# Patient Record
Sex: Female | Born: 1975
Health system: Southern US, Community
[De-identification: ages and names within clinical notes are randomized; demographics above are authoritative.]

## PROBLEM LIST (undated history)

## (undated) ENCOUNTER — Inpatient Hospital Stay (HOSPITAL_COMMUNITY): Payer: Self-pay

## (undated) DIAGNOSIS — M797 Fibromyalgia: Secondary | ICD-10-CM

## (undated) DIAGNOSIS — I82409 Acute embolism and thrombosis of unspecified deep veins of unspecified lower extremity: Secondary | ICD-10-CM

## (undated) DIAGNOSIS — D259 Leiomyoma of uterus, unspecified: Secondary | ICD-10-CM

## (undated) DIAGNOSIS — R519 Headache, unspecified: Secondary | ICD-10-CM

## (undated) DIAGNOSIS — D649 Anemia, unspecified: Secondary | ICD-10-CM

## (undated) DIAGNOSIS — R51 Headache: Secondary | ICD-10-CM

## (undated) DIAGNOSIS — Z319 Encounter for procreative management, unspecified: Secondary | ICD-10-CM

## (undated) DIAGNOSIS — K219 Gastro-esophageal reflux disease without esophagitis: Secondary | ICD-10-CM

## (undated) DIAGNOSIS — M1990 Unspecified osteoarthritis, unspecified site: Secondary | ICD-10-CM

## (undated) HISTORY — PX: FOOT SURGERY: SHX648

## (undated) HISTORY — PX: CARDIAC CATHETERIZATION: SHX172

## (undated) HISTORY — PX: TONSILLECTOMY: SUR1361

## (undated) HISTORY — DX: Fibromyalgia: M79.7

## (undated) HISTORY — PX: TONSILLECTOMY: SHX5217

---

## 1998-07-15 ENCOUNTER — Emergency Department (HOSPITAL_COMMUNITY): Admission: EM | Admit: 1998-07-15 | Discharge: 1998-07-15 | Payer: Self-pay | Admitting: *Deleted

## 2005-01-03 ENCOUNTER — Emergency Department (HOSPITAL_COMMUNITY): Admission: EM | Admit: 2005-01-03 | Discharge: 2005-01-03 | Payer: Self-pay | Admitting: Emergency Medicine

## 2006-05-13 ENCOUNTER — Ambulatory Visit: Payer: Self-pay | Admitting: Family Medicine

## 2007-01-26 ENCOUNTER — Inpatient Hospital Stay (HOSPITAL_COMMUNITY): Admission: AD | Admit: 2007-01-26 | Discharge: 2007-01-29 | Payer: Self-pay | Admitting: Obstetrics and Gynecology

## 2007-07-06 ENCOUNTER — Ambulatory Visit: Payer: Self-pay | Admitting: Family Medicine

## 2007-10-17 ENCOUNTER — Emergency Department (HOSPITAL_COMMUNITY): Admission: EM | Admit: 2007-10-17 | Discharge: 2007-10-17 | Payer: Self-pay | Admitting: Emergency Medicine

## 2008-02-17 ENCOUNTER — Emergency Department (HOSPITAL_COMMUNITY): Admission: EM | Admit: 2008-02-17 | Discharge: 2008-02-17 | Payer: Self-pay | Admitting: Emergency Medicine

## 2008-04-11 ENCOUNTER — Ambulatory Visit: Payer: Self-pay | Admitting: Family Medicine

## 2008-08-30 ENCOUNTER — Ambulatory Visit: Payer: Self-pay | Admitting: Family Medicine

## 2008-09-23 ENCOUNTER — Ambulatory Visit: Payer: Self-pay | Admitting: Family Medicine

## 2008-11-04 ENCOUNTER — Encounter: Admission: RE | Admit: 2008-11-04 | Discharge: 2008-11-04 | Payer: Self-pay | Admitting: Internal Medicine

## 2008-11-16 ENCOUNTER — Ambulatory Visit: Payer: Self-pay | Admitting: Family Medicine

## 2008-11-18 ENCOUNTER — Ambulatory Visit: Payer: Self-pay | Admitting: Family Medicine

## 2009-05-22 ENCOUNTER — Ambulatory Visit: Payer: Self-pay | Admitting: Family Medicine

## 2009-07-03 ENCOUNTER — Emergency Department (HOSPITAL_COMMUNITY): Admission: EM | Admit: 2009-07-03 | Discharge: 2009-07-04 | Payer: Self-pay | Admitting: Emergency Medicine

## 2010-04-01 LAB — DIFFERENTIAL
Eosinophils Relative: 2 % (ref 0–5)
Lymphocytes Relative: 33 % (ref 12–46)
Lymphs Abs: 3 10*3/uL (ref 0.7–4.0)
Neutro Abs: 4.9 10*3/uL (ref 1.7–7.7)
Neutrophils Relative %: 54 % (ref 43–77)

## 2010-04-01 LAB — URINALYSIS, ROUTINE W REFLEX MICROSCOPIC
Bilirubin Urine: NEGATIVE
Hgb urine dipstick: NEGATIVE
Specific Gravity, Urine: 1.026 (ref 1.005–1.030)
Urobilinogen, UA: 1 mg/dL (ref 0.0–1.0)

## 2010-04-01 LAB — POCT I-STAT, CHEM 8
BUN: 14 mg/dL (ref 6–23)
Chloride: 108 mEq/L (ref 96–112)
Creatinine, Ser: 0.6 mg/dL (ref 0.4–1.2)
Potassium: 3.8 mEq/L (ref 3.5–5.1)
Sodium: 140 mEq/L (ref 135–145)

## 2010-04-01 LAB — POCT PREGNANCY, URINE: Preg Test, Ur: NEGATIVE

## 2010-04-01 LAB — URINE MICROSCOPIC-ADD ON

## 2010-04-01 LAB — CBC
HCT: 31.2 % — ABNORMAL LOW (ref 36.0–46.0)
Platelets: 297 10*3/uL (ref 150–400)
WBC: 9.1 10*3/uL (ref 4.0–10.5)

## 2010-04-01 LAB — HEMOCCULT GUIAC POC 1CARD (OFFICE): Fecal Occult Bld: POSITIVE

## 2010-04-01 LAB — WET PREP, GENITAL

## 2010-05-01 LAB — DIFFERENTIAL
Basophils Absolute: 0 10*3/uL (ref 0.0–0.1)
Basophils Relative: 1 % (ref 0–1)
Eosinophils Relative: 3 % (ref 0–5)
Monocytes Absolute: 0.6 10*3/uL (ref 0.1–1.0)
Monocytes Relative: 8 % (ref 3–12)

## 2010-05-01 LAB — BASIC METABOLIC PANEL
CO2: 30 mEq/L (ref 19–32)
Chloride: 107 mEq/L (ref 96–112)
Glucose, Bld: 86 mg/dL (ref 70–99)
Potassium: 3.4 mEq/L — ABNORMAL LOW (ref 3.5–5.1)
Sodium: 141 mEq/L (ref 135–145)

## 2010-05-01 LAB — CBC
HCT: 34.5 % — ABNORMAL LOW (ref 36.0–46.0)
Hemoglobin: 11.4 g/dL — ABNORMAL LOW (ref 12.0–15.0)
MCHC: 33.1 g/dL (ref 30.0–36.0)
RBC: 4.31 MIL/uL (ref 3.87–5.11)
RDW: 14 % (ref 11.5–15.5)

## 2010-05-15 DIAGNOSIS — O139 Gestational [pregnancy-induced] hypertension without significant proteinuria, unspecified trimester: Secondary | ICD-10-CM

## 2010-05-15 DIAGNOSIS — O321XX0 Maternal care for breech presentation, not applicable or unspecified: Secondary | ICD-10-CM

## 2010-05-29 NOTE — Op Note (Signed)
Peggy [name], Peggy [name]                   ACCOUNT NO.:  [id number]   MEDICAL RECORD NO.:  [id number]          PATIENT TYPE:  INP   LOCATION:  9118                          FACILITY:  [facility]   PHYSICIAN:  Malva Limes, M.D.    DATE OF BIRTH:  24-Mar-1975   DATE OF PROCEDURE:  DATE OF DISCHARGE:                               OPERATIVE REPORT   PREOPERATIVE DIAGNOSES:  1. Intrauterine pregnancy at 72 and 1/2 weeks estimated gestational      age.  2. Homero Fellers breech presentation.  3. Pregnancy-induced hypertension.  4. Headaches.   POSTOPERATIVE DIAGNOSIS:  1. Intrauterine pregnancy at 81 and 1/2 weeks estimated gestational      age.  2. Homero Fellers breech presentation.  3. Pregnancy-induced hypertension.  4. Headaches.   PROCEDURE:  Primary low transverse cesarean section.   SURGEON:  Malva Limes, M.D.   ANESTHESIA:  Spinal.   ANTIBIOTICS:  Ancef 1 gram.   DRAINS:  Foley bedside drainage.   ESTIMATED BLOOD LOSS:  900 mL.   COMPLICATIONS:  None.   SPECIMENS:  None.   PROCEDURE:  The patient was taken to the operating room where a spinal  anesthetic was administered without complications.  She was then placed  in the dorsal supine position with a left lateral tilt.  The patient was  prepped with Betadine and a Foley catheter was placed.  She was draped  in the usual fashion for this procedure.  A Pfannenstiel incision was  made.  This was carried down the fascia.  The fascia was then entered in  the midline and extended laterally with the Metzenbaum scissors.  Rectus  muscles were dissected from the fascia with a Bovie.  Rectus muscles  were divided in the midline and taken superiorly and inferiorly.  Parietal peritoneum was entered sharply and taken superiorly and  inferiorly.  The bladder flap was taken down sharply.  A low transverse  uterine incision was made in the midline and extended laterally with  blunt dissection.  The infant was delivered in breech presentation.  On  delivery of the head, the oropharynx and nostrils were bulb suctioned.  The cord was doubly clamped and cut and the infant was handed to the  waiting NICU team.  The placenta was then manually removed.  The uterus  was exteriorized and cavity wiped with a wet lap.  The uterine cavity  had no evidence of any septums or abnormalities.  The uterine incision  was closed in a single layer of 0 Monocryl in a running locking fashion  and the bladder flap was closed using 2-0 Monocryl suture in a running  fashion.  The uterus was placed back in the abdominal cavity.  Hemostasis was again checked and found to be adequate.  The parietal  peritoneum and rectus muscles were reapproximated in the midline with 2-  0 Monocryl suture.  The fascia was closed with 0 Monocryl suture in a  running fashion.  Subcutaneous tissue was made hemostatic with the  Bovie.  Stainless steel clips were used to close the skin.  The patient  tolerated  the procedure well.  She was taken to the recovery room in  stable condition.  Instrument, lap counts correct x2.           ______________________________  Malva Limes, M.D.     MA/MEDQ  D:  01/26/2007  T:  01/27/2007  Job:  [id number]

## 2010-06-01 NOTE — Discharge Summary (Signed)
Peggy [name], Peggy [name]                   ACCOUNT NO.:  [id number]   MEDICAL RECORD NO.:  [id number]          PATIENT TYPE:  INP   LOCATION:  9118                          FACILITY:  [facility]   PHYSICIAN:  Malva Limes, M.D.    DATE OF BIRTH:  [birth year]   DATE OF ADMISSION:  01/26/2007  DATE OF DISCHARGE:  01/29/2007                               DISCHARGE SUMMARY   FINAL DIAGNOSIS:  Intrauterine pregnancy at 68 and 1/[redacted] weeks gestation,  frank breech presentation, pregnancy-induced hypertension, maternal  headaches,  positive group B strep.   PROCEDURE:  Primary low transverse cesarean section.   SURGEON:  Dr. Malva Limes.   COMPLICATIONS:  None.   This 35 year old G1 P0 presented at 37-plus weeks' gestation, presented  to the [facility] complaining of headaches.  The patient's blood  pressures were elevated at this time.  Her PIH panel was normal.  She  was given Procardia 10 mg, and blood pressures returned normal at this  point.  The patient had been scheduled for a cesarean section in the  next couple weeks for a breech presentation.  A discussion was held with  the patient, and decision was made to proceed with a cesarean section at  this time secondary to these findings.  The patient was taken to the  operating room on January 26, 2007, by Dr. Malva Limes, where a  primary low transverse cesarean section was performed with the delivery  of a 6 pound, 12 ounce female infant with Apgars of 9 and 9.  The delivery  went without complications.  The patient's postoperative course was  benign without significant fevers.  Blood pressures stabilized.  The  patient was felt ready for discharge on postoperative day #3.  She was  sent home on a regular diet, told to decrease activities, told to  continue her vitamins.  Was was given Tylox #30 one to two every four  hours as needed for pain, was told she could use ibuprofen up to 600 mg  every 6 hours as needed for pain.  She was to  monitor her blood pressure  and symptoms, was to follow up in our office on Monday to recheck her  blood pressure.  Instructions and precautions were reviewed with the  patient.   LABORATORIES ON DISCHARGE:  The patient had a hemoglobin of 11.4, white  blood cell count of 11.6, platelets of 164,000 and a normal PIH panel  that was performed on the 12th.      Leilani Able, P.A.-C.    ______________________________  Malva Limes, M.D.    MB/MEDQ  D:  02/17/2007  T:  02/17/2007  Job:  [id number]

## 2010-07-06 ENCOUNTER — Encounter: Payer: Self-pay | Admitting: Family Medicine

## 2010-07-06 DIAGNOSIS — M797 Fibromyalgia: Secondary | ICD-10-CM

## 2010-09-15 ENCOUNTER — Inpatient Hospital Stay (INDEPENDENT_AMBULATORY_CARE_PROVIDER_SITE_OTHER)
Admission: RE | Admit: 2010-09-15 | Discharge: 2010-09-15 | Disposition: A | Discharge status: Home / Self Care | Payer: BC Managed Care – PPO | Source: Ambulatory Visit | Attending: Family Medicine | Admitting: Family Medicine

## 2010-09-15 DIAGNOSIS — M545 Low back pain, unspecified: Secondary | ICD-10-CM

## 2010-09-15 LAB — CBC
HCT: 34.6 % — ABNORMAL LOW (ref 36.0–46.0)
MCHC: 30.3 g/dL (ref 30.0–36.0)
Platelets: 327 10*3/uL (ref 150–400)
RDW: 17.1 % — ABNORMAL HIGH (ref 11.5–15.5)

## 2010-09-15 LAB — POCT URINALYSIS DIP (DEVICE)
Glucose, UA: NEGATIVE mg/dL
Nitrite: NEGATIVE
Protein, ur: 30 mg/dL — AB
Urobilinogen, UA: 0.2 mg/dL (ref 0.0–1.0)

## 2010-09-15 LAB — HCG, SERUM, QUALITATIVE: Preg, Serum: NEGATIVE

## 2010-09-15 LAB — DIFFERENTIAL
Basophils Absolute: 0 10*3/uL (ref 0.0–0.1)
Eosinophils Absolute: 0.2 10*3/uL (ref 0.0–0.7)
Eosinophils Relative: 2 % (ref 0–5)
Lymphs Abs: 2.9 10*3/uL (ref 0.7–4.0)
Monocytes Absolute: 0.8 10*3/uL (ref 0.1–1.0)

## 2010-09-15 LAB — WET PREP, GENITAL

## 2010-09-15 LAB — POCT PREGNANCY, URINE: Preg Test, Ur: NEGATIVE

## 2010-09-16 LAB — URINE CULTURE
Colony Count: NO GROWTH
Culture  Setup Time: 201209011720

## 2010-09-17 ENCOUNTER — Inpatient Hospital Stay (INDEPENDENT_AMBULATORY_CARE_PROVIDER_SITE_OTHER)
Admission: RE | Admit: 2010-09-17 | Discharge: 2010-09-17 | Disposition: A | Discharge status: Home / Self Care | Payer: BC Managed Care – PPO | Source: Ambulatory Visit | Attending: Family Medicine | Admitting: Family Medicine

## 2010-09-17 DIAGNOSIS — M549 Dorsalgia, unspecified: Secondary | ICD-10-CM

## 2010-09-17 DIAGNOSIS — D649 Anemia, unspecified: Secondary | ICD-10-CM

## 2010-09-17 DIAGNOSIS — N898 Other specified noninflammatory disorders of vagina: Secondary | ICD-10-CM

## 2010-09-18 LAB — GC/CHLAMYDIA PROBE AMP, GENITAL: GC Probe Amp, Genital: NEGATIVE

## 2010-10-04 LAB — CBC
HCT: 40.7
Hemoglobin: 13.9
MCHC: 34.3
MCHC: 34.6
MCV: 88
Platelets: 164
RBC: 3.74 — ABNORMAL LOW
RDW: 14.8

## 2010-10-04 LAB — URINALYSIS, ROUTINE W REFLEX MICROSCOPIC
Protein, ur: NEGATIVE
Urobilinogen, UA: 0.2

## 2010-10-04 LAB — URINE MICROSCOPIC-ADD ON

## 2010-10-04 LAB — COMPREHENSIVE METABOLIC PANEL
BUN: 7
Calcium: 9.4
Glucose, Bld: 81
Sodium: 136
Total Protein: 6.3

## 2010-10-04 LAB — URIC ACID: Uric Acid, Serum: 5.3

## 2010-10-04 LAB — LACTATE DEHYDROGENASE: LDH: 169

## 2010-10-16 LAB — CBC
HCT: 39.3
Hemoglobin: 13.1
MCHC: 33.4
MCV: 82.3
RDW: 13.5

## 2010-10-16 LAB — BASIC METABOLIC PANEL
CO2: 26
Calcium: 9.3
Chloride: 107
Glucose, Bld: 118 — ABNORMAL HIGH
Sodium: 139

## 2010-10-16 LAB — URINALYSIS, ROUTINE W REFLEX MICROSCOPIC
Bilirubin Urine: NEGATIVE
Glucose, UA: NEGATIVE
Ketones, ur: NEGATIVE
Leukocytes, UA: NEGATIVE
Protein, ur: 30 — AB

## 2010-10-16 LAB — DIFFERENTIAL
Basophils Absolute: 0
Basophils Relative: 0
Eosinophils Absolute: 0.1
Eosinophils Relative: 2
Monocytes Absolute: 0.8
Neutro Abs: 3.9

## 2010-10-16 LAB — GC/CHLAMYDIA PROBE AMP, GENITAL
Chlamydia, DNA Probe: NEGATIVE
GC Probe Amp, Genital: NEGATIVE

## 2010-12-24 ENCOUNTER — Ambulatory Visit (INDEPENDENT_AMBULATORY_CARE_PROVIDER_SITE_OTHER): Payer: BC Managed Care – PPO | Admitting: Family Medicine

## 2010-12-24 ENCOUNTER — Encounter: Payer: Self-pay | Admitting: Family Medicine

## 2010-12-24 DIAGNOSIS — J111 Influenza due to unidentified influenza virus with other respiratory manifestations: Secondary | ICD-10-CM

## 2010-12-24 DIAGNOSIS — J101 Influenza due to other identified influenza virus with other respiratory manifestations: Secondary | ICD-10-CM

## 2010-12-24 DIAGNOSIS — R059 Cough, unspecified: Secondary | ICD-10-CM

## 2010-12-24 DIAGNOSIS — R509 Fever, unspecified: Secondary | ICD-10-CM

## 2010-12-24 DIAGNOSIS — R05 Cough: Secondary | ICD-10-CM

## 2010-12-24 LAB — POCT INFLUENZA A/B
Influenza A, POC: POSITIVE
Influenza B, POC: NEGATIVE

## 2010-12-24 NOTE — Progress Notes (Signed)
Chief Complaint: coughing,fever, mid back pain and vomiting off and on since Saturday  HPI: 2 days ago began with headache and vomiting, then started with lower back pain, fever to 101, and then coughing began late last night.  +runny nose, slight sore throat, sinus pressure behind both eyes.  +postnasal drip.  Nasal mucus is bloody, sometimes clear, sometimes thick and yellow.  Cough is dry.  +ongoing nausea, vomited after eating today (not post-tussive); no diarrhea.  Hasn't had a BM in 2 days, but has eaten very little.  Decreased appetite.  +sick contact--her husband with similar but milder illness.  Has fibromyalgia, so has chronic pain.  Hasn't noticed a significant difference in myalgias with this illness  Hasn't gotten a flu shot this year.  Past Medical History  Diagnosis Date  . Fibromyalgia     Past Surgical History  Procedure Date  . Cesarean section   . Tonsillectomy   . Foot surgery     right foot    History   Social History  . Marital Status: Married    Spouse Name: N/A    Number of Children: 1  . Years of Education: N/A   Occupational History  . Occupational hygienist    Social History Main Topics  . Smoking status: Never Smoker   . Smokeless tobacco: Never Used  . Alcohol Use: Yes     1-2 drinks per month.  . Drug Use: No  . Sexually Active: Not on file   Other Topics Concern  . Not on file   Social History Narrative  . No narrative on file   Current outpatient prescriptions:pregabalin (LYRICA) 75 MG capsule, Take 75 mg by mouth 2 (two) times daily.  , Disp: , Rfl: ;  traMADol (ULTRAM) 50 MG tablet, Take 50 mg by mouth as needed. , Disp: , Rfl:   No Known Allergies  ROS:  Denies rashes, diarrhea.  See HPI.  LMP 1.5 weeks ago  PHYSICAL EXAM: BP 120/70  Pulse 80  Temp(Src) 101 F (38.3 C) (Oral)  Ht 5\' 7"  (1.702 m)  Wt 163 lb (73.936 kg)  BMI 25.53 kg/m2  LMP 12/13/2010 Mildly ill-appearing female in no distress Rare coughing, no sniffling TM's and  EAC's normal.  OP clear.  PERRL, EOMI, conjunctiva clear. Nasal mucosa fairly normal, sinuses nontender Heart: regular rate and rhythm without murmur Lungs: clear bilaterally Abdomen: mild tenderness at umbilicus to right side of abdomen including RLQ.  No rebound tenderness or guarding, is more diffuse, and mild.  Normal bowel sounds Skin: no rash  ASSESSMENT/PLAN: 1. Fever  POCT Influenza A/B  2. Cough  POCT Influenza A/B  3. Influenza A      +influenza A Supportive management F/u if worsening symptoms, worsening abdominal pain

## 2010-12-24 NOTE — Patient Instructions (Addendum)
Fever control with either tylenol and /or ibuprofen or aleve (pain, fever, body aches) Decongestants (ie sudafed--for sinus pain and runny nose), expectorants (ie Mucinex--if you're having thick phlegm). If cough is worse, and not controlled by Mucinex alone, then can add cough suppressant (ie Delsym syrup or change to the Mucinex DM)  Return if fevers persist or worsen, ongoing or worsening symptoms, especially any shortness of breath, chest pain (other than from coughing) or worsening ab abdominal pain.  Keep yourself well hydrated, although drink slowly.  Influenza Facts Flu (influenza) is a contagious respiratory illness caused by the influenza viruses. It can cause mild to severe illness. While most healthy people recover from the flu without specific treatment and without complications, older people, young children, and people with certain health conditions are at higher risk for serious complications from the flu, including death. CAUSES   The flu virus is spread from person to person by respiratory droplets from coughing and sneezing.   A person can also become infected by touching an object or surface with a virus on it and then touching their mouth, eye or nose.   Adults may be able to infect others from 1 day before symptoms occur and up to 7 days after getting sick. So it is possible to give someone the flu even before you know you are sick and continue to infect others while you are sick.  SYMPTOMS   Fever (usually high).   Headache.   Tiredness (can be extreme).   Cough.   Sore throat.   Runny or stuffy nose.   Body aches.   Diarrhea and vomiting may also occur, particularly in children.   These symptoms are referred to as "flu-like symptoms". A lot of different illnesses, including the common cold, can have similar symptoms.  DIAGNOSIS   There are tests that can determine if you have the flu as long you are tested within the first 2 or 3 days of illness.   A  doctor's exam and additional tests may be needed to identify if you have a disease that is a complicating the flu.  RISKS AND COMPLICATIONS  Some of the complications caused by the flu include:  Bacterial pneumonia or progressive pneumonia caused by the flu virus.   Loss of body fluids (dehydration).   Worsening of chronic medical conditions, such as heart failure, asthma, or diabetes.   Sinus problems and ear infections.  HOME CARE INSTRUCTIONS   Seek medical care early on.   If you are at high risk from complications of the flu, consult your health-care provider as soon as you develop flu-like symptoms. Those at high risk for complications include:   People 65 years or older.   People with chronic medical conditions, including diabetes.   Pregnant women.   Young children.   Your caregiver may recommend use of an antiviral medication to help treat the flu.   If you get the flu, get plenty of rest, drink a lot of liquids, and avoid using alcohol and tobacco.   You can take over-the-counter medications to relieve the symptoms of the flu if your caregiver approves. (Never give aspirin to children or teenagers who have flu-like symptoms, particularly fever).  PREVENTION  The single best way to prevent the flu is to get a flu vaccine each fall. Other measures that can help protect against the flu are:  Antiviral Medications   A number of antiviral drugs are approved for use in preventing the flu. These are prescription medications,  and a doctor should be consulted before they are used.   Habits for Good Health   Cover your nose and mouth with a tissue when you cough or sneeze, throw the tissue away after you use it.   Wash your hands often with soap and water, especially after you cough or sneeze. If you are not near water, use an alcohol-based hand cleaner.   Avoid people who are sick.   If you get the flu, stay home from work or school. Avoid contact with other people so  that you do not make them sick, too.   Try not to touch your eyes, nose, or mouth as germs ore often spread this way.  IN CHILDREN, EMERGENCY WARNING SIGNS THAT NEED URGENT MEDICAL ATTENTION:  Fast breathing or trouble breathing.   Bluish skin color.   Not drinking enough fluids.   Not waking up or not interacting.   Being so irritable that the child does not want to be held.   Flu-like symptoms improve but then return with fever and worse cough.   Fever with a rash.  IN ADULTS, EMERGENCY WARNING SIGNS THAT NEED URGENT MEDICAL ATTENTION:  Difficulty breathing or shortness of breath.   Pain or pressure in the chest or abdomen.   Sudden dizziness.   Confusion.   Severe or persistent vomiting.  SEEK IMMEDIATE MEDICAL CARE IF:  You or someone you know is experiencing any of the symptoms above. When you arrive at the emergency center,report that you think you have the flu. You may be asked to wear a mask and/or sit in a secluded area to protect others from getting sick. MAKE SURE YOU:   Understand these instructions.   Monitor your condition.   Seek medical care if you are getting worse, or not improving.  Document Released: 01/03/2003 Document Revised: 09/12/2010 Document Reviewed: 09/29/2008 [facility] Patient Information 2012 [place], [place].

## 2011-02-28 ENCOUNTER — Encounter: Payer: Self-pay | Admitting: Internal Medicine

## 2011-03-06 ENCOUNTER — Ambulatory Visit (INDEPENDENT_AMBULATORY_CARE_PROVIDER_SITE_OTHER): Payer: BC Managed Care – PPO | Admitting: Family Medicine

## 2011-03-06 ENCOUNTER — Encounter: Payer: Self-pay | Admitting: Family Medicine

## 2011-03-06 DIAGNOSIS — Z0000 Encounter for general adult medical examination without abnormal findings: Secondary | ICD-10-CM

## 2011-03-06 DIAGNOSIS — Z23 Encounter for immunization: Secondary | ICD-10-CM

## 2011-03-06 DIAGNOSIS — Z111 Encounter for screening for respiratory tuberculosis: Secondary | ICD-10-CM

## 2011-03-06 DIAGNOSIS — M797 Fibromyalgia: Secondary | ICD-10-CM

## 2011-03-06 DIAGNOSIS — R5381 Other malaise: Secondary | ICD-10-CM

## 2011-03-06 DIAGNOSIS — R5383 Other fatigue: Secondary | ICD-10-CM

## 2011-03-06 DIAGNOSIS — IMO0001 Reserved for inherently not codable concepts without codable children: Secondary | ICD-10-CM

## 2011-03-06 LAB — LIPID PANEL
HDL: 55 mg/dL (ref >39)
Total CHOL/HDL Ratio: 4.4 Ratio

## 2011-03-06 LAB — COMPREHENSIVE METABOLIC PANEL
AST: 27 U/L (ref 0–37)
BUN: 10 mg/dL (ref 6–23)
Calcium: 9.4 mg/dL (ref 8.4–10.5)
Chloride: 105 mEq/L (ref 96–112)
Creat: 0.61 mg/dL (ref 0.50–1.10)

## 2011-03-06 NOTE — Progress Notes (Signed)
  Subjective:    Patient ID: Peggy [name], female    DOB: 15-Oct-1975, 36 y.o.   MRN: [id number]  HPI She is here for a complete examination. She does get regular gynecologic care. She also has underlying fibromyalgia and is being followed by a rheumatologist. Presently she is on Lyrica as well as Ultram. She is having more difficulty with fatigue as well as muscle aches and does plan to see her rheumatologist in the near future. She and her husband are in the process of trying to adopt.   Review of Systems  Constitutional: Positive for fatigue.  HENT: Negative.   Eyes: Negative.   Respiratory: Negative.   Cardiovascular: Negative.   Gastrointestinal: Negative.   Genitourinary: Negative.   Musculoskeletal: Positive for myalgias.  Neurological: Positive for weakness.  Hematological: Negative.   Psychiatric/Behavioral: Negative.        Objective:   Physical Exam BP 116/70  Pulse 92  Ht 5\' 7"  (1.702 m)  Wt 166 lb (75.297 kg)  BMI 26.00 kg/m2  General Appearance:    Alert, cooperative, no distress, appears stated age  Head:    Normocephalic, without obvious abnormality, atraumatic  Eyes:    PERRL, conjunctiva/corneas clear, EOM's intact, fundi    benign  Ears:    Normal TM's and external ear canals  Nose:   Nares normal, mucosa normal, no drainage or sinus   tenderness  Throat:   Lips, mucosa, and tongue normal; teeth and gums normal  Neck:   Supple, no lymphadenopathy;  thyroid:  no   enlargement/tenderness/nodules; no carotid   bruit or JVD  Back:    Spine nontender, no curvature, ROM normal, no CVA     tenderness  Lungs:     Clear to auscultation bilaterally without wheezes, rales or     ronchi; respirations unlabored  Chest Wall:    No tenderness or deformity   Heart:    Regular rate and rhythm, S1 and S2 normal, no murmur, rub   or gallop  Breast Exam:    Deferred to GYN  Abdomen:     Soft, non-tender, nondistended, normoactive bowel sounds,    no masses, no  hepatosplenomegaly  Genitalia:    Deferred to GYN     Extremities:   No clubbing, cyanosis or edema  Pulses:   2+ and symmetric all extremities  Skin:   Skin color, texture, turgor normal, no rashes or lesions  Lymph nodes:   Cervical, supraclavicular, and axillary nodes normal  Neurologic:   CNII-XII intact, normal strength, sensation and gait; reflexes 2+ and symmetric throughout          Psych:   Normal mood, affect, hygiene and grooming.          Assessment & Plan:   1. Routine general medical examination at a health care facility  TB Skin Test, CBC with Differential, Comprehensive metabolic panel, Lipid panel, Tdap vaccine greater than or equal to 7yo IM  2. Fibromyalgia muscle pain  CBC with Differential, Comprehensive metabolic panel  3. Fatigue  TSH  4. Screening examination for pulmonary tuberculosis

## 2011-03-07 LAB — CBC WITH DIFFERENTIAL/PLATELET
Basophils Absolute: 0 10*3/uL (ref 0.0–0.1)
Basophils Relative: 0 % (ref 0–1)
Eosinophils Absolute: 0.2 10*3/uL (ref 0.0–0.7)
Eosinophils Relative: 2 % (ref 0–5)
HCT: 35.9 % — ABNORMAL LOW (ref 36.0–46.0)
Lymphocytes Relative: 39 % (ref 12–46)
MCH: 21.1 pg — ABNORMAL LOW (ref 26.0–34.0)
MCHC: 29 g/dL — ABNORMAL LOW (ref 30.0–36.0)
MCV: 72.7 fL — ABNORMAL LOW (ref 78.0–100.0)
Monocytes Absolute: 0.6 10*3/uL (ref 0.1–1.0)
RDW: 17.8 % — ABNORMAL HIGH (ref 11.5–15.5)

## 2011-04-08 ENCOUNTER — Emergency Department (INDEPENDENT_AMBULATORY_CARE_PROVIDER_SITE_OTHER): Payer: BC Managed Care – PPO

## 2011-04-08 ENCOUNTER — Emergency Department (INDEPENDENT_AMBULATORY_CARE_PROVIDER_SITE_OTHER)
Admission: EM | Admit: 2011-04-08 | Discharge: 2011-04-08 | Disposition: A | Discharge status: Home / Self Care | Payer: BC Managed Care – PPO | Source: Home / Self Care | Attending: Emergency Medicine | Admitting: Emergency Medicine

## 2011-04-08 ENCOUNTER — Encounter (HOSPITAL_COMMUNITY): Payer: Self-pay

## 2011-04-08 ENCOUNTER — Emergency Department (HOSPITAL_COMMUNITY): Payer: BC Managed Care – PPO

## 2011-04-08 DIAGNOSIS — S59909A Unspecified injury of unspecified elbow, initial encounter: Secondary | ICD-10-CM

## 2011-04-08 DIAGNOSIS — S6990XA Unspecified injury of unspecified wrist, hand and finger(s), initial encounter: Secondary | ICD-10-CM

## 2011-04-08 DIAGNOSIS — S6991XA Unspecified injury of right wrist, hand and finger(s), initial encounter: Secondary | ICD-10-CM

## 2011-04-08 HISTORY — DX: Encounter for procreative management, unspecified: Z31.9

## 2011-04-08 MED ORDER — IBUPROFEN 800 MG PO TABS: 800.0000 mg | ORAL_TABLET | Freq: Once | ORAL | Status: DC

## 2011-04-08 MED ORDER — HYDROCODONE-ACETAMINOPHEN 5-325 MG PO TABS: 2.0000 | ORAL_TABLET | ORAL | Status: AC | PRN

## 2011-04-08 MED ORDER — IBUPROFEN 800 MG PO TABS
ORAL_TABLET | ORAL | Status: AC
  Filled 2011-04-08: qty 1

## 2011-04-08 MED ORDER — ACETAMINOPHEN 325 MG PO TABS
ORAL_TABLET | ORAL | Status: AC
  Filled 2011-04-08: qty 3

## 2011-04-08 MED ORDER — ACETAMINOPHEN 500 MG PO TABS
1000.0000 mg | ORAL_TABLET | Freq: Once | ORAL | Status: AC
  Administered 2011-04-08: 1000 mg via ORAL

## 2011-04-08 NOTE — ED Notes (Signed)
States she tripped over husband's feet last PM, fell onto right arm; has pain in right distal forarm/wrist

## 2011-04-08 NOTE — ED Provider Notes (Signed)
History     CSN: [id number]  Arrival date & time 04/08/11  1144   First MD Initiated Contact with Patient 04/08/11 1212      Chief Complaint  Patient presents with  . Wrist Pain    (Consider location/radiation/quality/duration/timing/severity/associated sxs/prior treatment) HPI Comments: Less patient tripped over her husband feet and felt her right arm specifically her right wrist has been expressing pain since last night and obvious swelling this morning. Patient describes some tingling on her right fingers distally.  Patient is a 36 y.o. female presenting with wrist pain.  Wrist Pain This is a new problem. The current episode started yesterday. The problem occurs constantly. The problem has been gradually worsening. Pertinent negatives include no chest pain. The symptoms are aggravated by bending and walking. The symptoms are relieved by ice. She has tried a cold compress for the symptoms. The treatment provided no relief.    Past Medical History  Diagnosis Date  . Fibromyalgia   . Infertility management     Past Surgical History  Procedure Date  . Cesarean section   . Tonsillectomy   . Foot surgery     right foot    History reviewed. No pertinent family history.  History  Substance Use Topics  . Smoking status: Never Smoker   . Smokeless tobacco: Never Used  . Alcohol Use: Yes     1-2 drinks per month.    OB History    Grav Para Term Preterm Abortions TAB SAB Ect Mult Living                  Review of Systems  Constitutional: Negative for fever and appetite change.  Cardiovascular: Negative for chest pain.  Musculoskeletal: Positive for joint swelling.  Neurological: Positive for numbness. Negative for weakness.    Allergies  Review of patient's allergies indicates no known allergies.  Home Medications   Current Outpatient Rx  Name Route Sig Dispense Refill  . HYDROCODONE-ACETAMINOPHEN 5-325 MG PO TABS Oral Take 2 tablets by mouth every 4 (four)  hours as needed for pain. 10 tablet 0  . PREGABALIN 75 MG PO CAPS Oral Take 75 mg by mouth 2 (two) times daily.      . TRAMADOL HCL 50 MG PO TABS Oral Take 50 mg by mouth as needed.       BP 114/76  Pulse 87  Temp(Src) 98.5 F (36.9 C) (Oral)  Resp 14  SpO2 99%  LMP 03/23/2011  Physical Exam  Nursing note and vitals reviewed. Constitutional: She appears well-developed and well-nourished.  Neck: Neck supple.  Musculoskeletal:       Right wrist: She exhibits decreased range of motion, tenderness, bony tenderness and swelling. She exhibits no crepitus, no deformity and no laceration.       Arms: Neurological: She is alert.  Skin: No rash noted.    ED Course  Procedures (including critical care time)  Labs Reviewed - No data to display Dg Wrist Complete Right  04/08/2011  *RADIOLOGY REPORT*  Clinical Data: Larey Seat.  Right wrist pain.  RIGHT WRIST - COMPLETE 3+ VIEW  Comparison: None  Findings: The joint spaces are maintained.  No acute fracture.  IMPRESSION: No acute bony findings.  Original Report Authenticated By: P. Loralie Champagne, M.D.     1. Right wrist injury       MDM  Recommended patient to followup with an orthopedic Dr in 7-10 days if focalized tenderness persists. Radiologist reported x-ray is negative. Did notice a mild  cortical what seemed to be cortical disruption on the radial surface. If partial immobilize his wrist and have recommended to keep elevated as much as possible next 72 hours        Jimmie Molly, MD 04/08/11 1449

## 2011-04-08 NOTE — Discharge Instructions (Signed)
S. discuss if significant discomfort persists beyond 7-10 days followup with the orthopedic Dr. as discussed as you may have a cold fracture. For the next 48 hours try to keep your arm elevated as much as possible

## 2011-05-03 ENCOUNTER — Other Ambulatory Visit: Payer: Self-pay | Admitting: Family Medicine

## 2011-05-03 MED ORDER — TRAMADOL HCL 50 MG PO TABS: 50.0000 mg | ORAL_TABLET | ORAL | Status: DC | PRN

## 2011-08-17 ENCOUNTER — Emergency Department (HOSPITAL_COMMUNITY)
Admission: EM | Admit: 2011-08-17 | Discharge: 2011-08-17 | Disposition: A | Discharge status: Home / Self Care | Payer: BC Managed Care – PPO | Attending: Emergency Medicine | Admitting: Emergency Medicine

## 2011-08-17 ENCOUNTER — Encounter (HOSPITAL_COMMUNITY): Payer: Self-pay | Admitting: Emergency Medicine

## 2011-08-17 ENCOUNTER — Emergency Department (HOSPITAL_COMMUNITY): Payer: BC Managed Care – PPO

## 2011-08-17 DIAGNOSIS — D649 Anemia, unspecified: Secondary | ICD-10-CM

## 2011-08-17 DIAGNOSIS — H538 Other visual disturbances: Secondary | ICD-10-CM | POA: Insufficient documentation

## 2011-08-17 DIAGNOSIS — H53149 Visual discomfort, unspecified: Secondary | ICD-10-CM | POA: Insufficient documentation

## 2011-08-17 DIAGNOSIS — IMO0001 Reserved for inherently not codable concepts without codable children: Secondary | ICD-10-CM | POA: Insufficient documentation

## 2011-08-17 DIAGNOSIS — Z79899 Other long term (current) drug therapy: Secondary | ICD-10-CM | POA: Insufficient documentation

## 2011-08-17 LAB — CBC
MCH: 21.4 pg — ABNORMAL LOW (ref 26.0–34.0)
MCHC: 30.6 g/dL (ref 30.0–36.0)
MCV: 70 fL — ABNORMAL LOW (ref 78.0–100.0)
Platelets: 320 10*3/uL (ref 150–400)
RDW: 16.3 % — ABNORMAL HIGH (ref 11.5–15.5)

## 2011-08-17 LAB — BASIC METABOLIC PANEL
Calcium: 8.8 mg/dL (ref 8.4–10.5)
Creatinine, Ser: 0.61 mg/dL (ref 0.50–1.10)
GFR calc Af Amer: >90 mL/min (ref >90)

## 2011-08-17 MED ORDER — TEARS RENEWED OP SOLN: 2.0000 [drp] | Freq: Four times a day (QID) | OPHTHALMIC | Status: DC | PRN

## 2011-08-17 MED ORDER — TETRACAINE HCL 0.5 % OP SOLN
1.0000 [drp] | Freq: Once | OPHTHALMIC | Status: AC
  Administered 2011-08-17: 2 [drp] via OPHTHALMIC
  Filled 2011-08-17: qty 2

## 2011-08-17 NOTE — ED Provider Notes (Signed)
History     CSN: [id number] Arrival date & time 08/17/11  1118 First MD Initiated Contact with Patient 08/17/11 1152      Chief Complaint  Patient presents with  . Blurred Vision   HPI Pt presents with blurred vision that started this morning when she woke up.  Pt states her vision is blurred in both eyes.  She can still see light and objects but they are very blurry.  Her eyes feel like they are burning as well.  No trauma.  No foreign bodies or chemicals to her eyes.  Past Medical History  Diagnosis Date  . Fibromyalgia   . Infertility management     Past Surgical History  Procedure Date  . Cesarean section   . Tonsillectomy   . Foot surgery     right foot  . Cardiac catheterization approx 8 years ago    for chest pain,     History reviewed. No pertinent family history.  History  Substance Use Topics  . Smoking status: Never Smoker   . Smokeless tobacco: Never Used  . Alcohol Use: Yes     1-2 drinks per month.    OB History    Grav Para Term Preterm Abortions TAB SAB Ect Mult Living                  Review of Systems  All other systems reviewed and are negative.    Allergies  Review of patient's allergies indicates no known allergies.  Home Medications   Current Outpatient Rx  Name Route Sig Dispense Refill  . PREGABALIN 75 MG PO CAPS Oral Take 75 mg by mouth 2 (two) times daily.     . TRAMADOL HCL 50 MG PO TABS Oral Take 50 mg by mouth every 8 (eight) hours as needed. PAIN      BP 138/92  Pulse 91  Temp 98.8 F (37.1 C) (Oral)  Resp 16  SpO2 100%  LMP 08/12/2011  Physical Exam  Nursing note and vitals reviewed. Constitutional: She appears well-developed and well-nourished. No distress.  HENT:  Head: Normocephalic and atraumatic.  Right Ear: External ear normal.  Left Ear: External ear normal.  Eyes: Conjunctivae and EOM are normal. Pupils are equal, round, and reactive to light. Right eye exhibits no discharge. Left eye exhibits no  discharge. Right conjunctiva is not injected. Right conjunctiva has no hemorrhage. Left conjunctiva is not injected. Left conjunctiva has no hemorrhage. No scleral icterus. Right eye exhibits normal extraocular motion. Left eye exhibits normal extraocular motion.  Fundoscopic exam:      The right eye shows no exudate, no hemorrhage and no papilledema.       The left eye shows no exudate, no hemorrhage and no papilledema.  Neck: Neck supple. No tracheal deviation present.  Cardiovascular: Normal rate, regular rhythm and intact distal pulses.   Pulmonary/Chest: Effort normal and breath sounds normal. No stridor. No respiratory distress. She has no wheezes. She has no rales.  Abdominal: Soft. Bowel sounds are normal. She exhibits no distension. There is no tenderness. There is no rebound and no guarding.  Musculoskeletal: She exhibits no edema and no tenderness.  Neurological: She is alert. She has normal strength. No sensory deficit. Cranial nerve deficit:  no gross defecits noted. She exhibits normal muscle tone. She displays no seizure activity. Coordination normal.  Skin: Skin is warm and dry. No rash noted.  Psychiatric: She has a normal mood and affect.    ED Course  Procedures (  including critical care time)   Labs Reviewed  CBC  BASIC METABOLIC PANEL   Ct Head Wo Contrast  08/17/2011  *RADIOLOGY REPORT*  Clinical Data: Blurred vision.  Photophobia.  CT HEAD WITHOUT CONTRAST  Technique:  Contiguous axial images were obtained from the base of the skull through the vertex without contrast.  Comparison: Head CT 02/17/2008.  Findings: No acute intracranial abnormality.  Specifically, no evidence of acute/subacute cerebral ischemia, no evidence of acute intracerebral hemorrhage, no focal mass, mass effect, hydrocephalus or abnormal intra or extra-axial fluid collections.  No acute displaced skull fractures are identified.  Visualized paranasal sinuses and mastoids are well pneumatized.   IMPRESSION: 1.  No acute intracranial abnormalities. 2.  The appearance of the brain is normal.  Original Report Authenticated By: Florencia Reasons, M.D.     1. Blurred vision   2. Anemia       MDM  Ocular pressure 16 bilaterally.  Bilateral acute vision changes.  NO mass noted on CT.  Atypical for posterior circulation event.  Pt does have some eye discomfort.  ?neuritis (unlikely bilaterally).  No sign of glaucoma or acute infection.  Pt denies chemical exposure.  D/w Dr Allyne Gee who recommends eye lubricants (possible unknown chemical exposure) and follow up with him on Monday.        Celene Kras, MD 08/17/11 1409

## 2011-08-17 NOTE — ED Notes (Addendum)
C/o blurry vision started 2 days ago- went away, but started again this morning. Both eyes- equally blurry per pt. Denies headache. Has eaten this morning,  Has far away "stare" not looking at staff-

## 2011-08-20 ENCOUNTER — Other Ambulatory Visit: Payer: Self-pay | Admitting: Ophthalmology

## 2011-08-20 ENCOUNTER — Inpatient Hospital Stay
Admission: RE | Admit: 2011-08-20 | Discharge: 2011-08-20 | Payer: BC Managed Care – PPO | Source: Ambulatory Visit | Attending: Ophthalmology | Admitting: Ophthalmology

## 2011-08-20 ENCOUNTER — Other Ambulatory Visit: Payer: BC Managed Care – PPO

## 2011-08-20 DIAGNOSIS — H468 Other optic neuritis: Secondary | ICD-10-CM

## 2011-08-21 ENCOUNTER — Ambulatory Visit
Admission: RE | Admit: 2011-08-21 | Discharge: 2011-08-21 | Disposition: A | Discharge status: Home / Self Care | Payer: BC Managed Care – PPO | Source: Ambulatory Visit | Attending: Ophthalmology | Admitting: Ophthalmology

## 2011-08-21 DIAGNOSIS — H468 Other optic neuritis: Secondary | ICD-10-CM

## 2011-08-21 MED ORDER — GADOBENATE DIMEGLUMINE 529 MG/ML IV SOLN
15.0000 mL | Freq: Once | INTRAVENOUS | Status: AC | PRN
  Administered 2011-08-21: 15 mL via INTRAVENOUS

## 2011-08-27 DIAGNOSIS — Z0271 Encounter for disability determination: Secondary | ICD-10-CM

## 2011-09-07 ENCOUNTER — Telehealth: Payer: Self-pay | Admitting: Family Medicine

## 2011-09-07 NOTE — Telephone Encounter (Signed)
LM

## 2011-09-19 ENCOUNTER — Ambulatory Visit (INDEPENDENT_AMBULATORY_CARE_PROVIDER_SITE_OTHER): Payer: Managed Care, Other (non HMO) | Admitting: Medical

## 2011-09-19 ENCOUNTER — Encounter: Payer: Self-pay | Admitting: Medical

## 2011-09-19 VITALS — BP 100/70 | HR 76 | Temp 98.6°F | Resp 16 | Wt 172.0 lb

## 2011-09-19 DIAGNOSIS — L255 Unspecified contact dermatitis due to plants, except food: Secondary | ICD-10-CM

## 2011-09-19 MED ORDER — PREDNISONE 10 MG PO TABS: ORAL_TABLET | ORAL | Status: DC

## 2011-09-19 MED ORDER — TRIAMCINOLONE ACETONIDE 0.1 % EX CREA: TOPICAL_CREAM | Freq: Two times a day (BID) | CUTANEOUS | Status: DC

## 2011-09-19 NOTE — Progress Notes (Signed)
  Subjective:     Peggy [name] is a 36 y.o. female who presents for rash on right forearm.   Started 6 days ago.  She had been pulling weeds in the garden.  She was wearing tshirt and gloves.  Later in the day got rash that has persisted despite OTC hydrocortisone cream, hydrogen peroxide, and oral benadryl.  Rash is itchy.   Denies rash anywhere else.  Denies any other new exposures.  No prior similar.  Denies fever, insect bite, contacts with similar rash.   Review of Systems As noted in HPI  Objective:    Filed Vitals:   09/19/11 1532  BP: 100/70  Pulse: 76  Temp: 98.6 F (37 C)  Resp: 16    General appearance: alert, no distress, WD/WN, female  Skin: right forearm volar and somewhat dorsal surface with some linear and some amorphous patches of erythema and whealed lesions.  Some crusted.   No induration, fluctuant, or warmth.   otherwise skin without lesions, no other rash.     Assessment:   Encounter Diagnosis  Name Primary?  . Plant dermatitis Yes    Plan:   Discussed diagnosis, causes, treatment, prevention.  Avoid reexposure.   Patient Instructions  Rash appears to be contact dermatitis from plant  Begin Triamcinolone cream twice daily to the right arm.    Use benadryl OTC.  You can use 25-50mg  at bedtime.   You can use 12.5 - 25mg  up to 3 times daily during the day for rash and itch.   Clean any tools, clothes, pets, etc. With hot soapy watery that may have been contaminated with the plant oil.    If the rash is much worse and spreading over the weekend, then begin Prednisone steroid dose pak.  If not, then you don't need to use the steroid dose pak.

## 2011-09-19 NOTE — Patient Instructions (Signed)
Rash appears to be contact dermatitis from plant  Begin Triamcinolone cream twice daily to the right arm.    Use benadryl OTC.  You can use 25-50mg  at bedtime.   You can use 12.5 - 25mg  up to 3 times daily during the day for rash and itch.   Clean any tools, clothes, pets, etc. With hot soapy watery that may have been contaminated with the plant oil.    If the rash is much worse and spreading over the weekend, then begin Prednisone steroid dose pak.  If not, then you don't need to use the steroid dose pak.

## 2011-10-02 ENCOUNTER — Encounter: Payer: Self-pay | Admitting: Family Medicine

## 2011-10-02 ENCOUNTER — Ambulatory Visit (INDEPENDENT_AMBULATORY_CARE_PROVIDER_SITE_OTHER): Payer: Managed Care, Other (non HMO) | Admitting: Family Medicine

## 2011-10-02 VITALS — BP 128/72 | HR 68 | Ht 66.0 in | Wt 172.0 lb

## 2011-10-02 DIAGNOSIS — R259 Unspecified abnormal involuntary movements: Secondary | ICD-10-CM

## 2011-10-02 DIAGNOSIS — R251 Tremor, unspecified: Secondary | ICD-10-CM

## 2011-10-02 NOTE — Patient Instructions (Addendum)
Dr. Zannie Cove office [facility]) will be contacting you to schedule an appointment before the end of next week.  Call them (or [place]) if you have worsening or new acute symptoms for sooner appointment, but it sounds like they will be calling for a pretty quick appointment, and as things are now, it is okay to wait.

## 2011-10-02 NOTE — Progress Notes (Signed)
Chief Complaint  Patient presents with  . Advice Only    saw specialist last week @ [facility](Dr.Martin) diagnosed with optic neuritis. Past 3 days she has been having episodes of uncontrollable shaking-Monday starting with left arm and the same yesterday. Today has been off and on all day instead of the short 15 minutes episodes.   Patient reports being recently diagnosed with optic neuritis, seeing physician at [facility].  She has ongoing problems with vision--slightly blurred, "dark", and pain with certain eye movements.  A few flashes of light noted.  Dr. Daphine Deutscher had discussed possible IV steroid treatment.  Never treated with oral steroids. He was awaiting evaluation by a neurologist, and she has appt scheduled with neuro at [facility] 10/25/11.  Started having shaking of L hand 2 days ago.  Episodes last for about 20 minutes, although current episode is lasting longer.  3 episodes today, usually just getting 1/day for the last 2 days. Feels more like tremor, or spasms, rather than seizure activity.  Limited to her L hand/forearm.  +numbness, tingling and weakness in the left arm.  Has always had some twitching in her fingers in her right arm.  Has tingling in right forearm into fingers more recently.  Tingling in both legs, L>R, started a couple of months ago.  MRI from 8/7: IMPRESSION:  1. No evidence of acute ischemia.  2. Nonspecific well-defined foci of hyperintensity  supratentorially. Differential considerations include areas of  ischemic gliosis related to small vessel disease due to  hypertension ,versus a demyelinating process or vasculitis.  3. Mild inflammatory mucosal reaction in the paranasal sinuses.  4. No evidence of pathologic signal, mass effect or enhancement in  the orbital regions.  Past Medical History  Diagnosis Date  . Fibromyalgia   . Infertility management    Past Surgical History  Procedure Date  . Cesarean section   . Tonsillectomy   . Foot surgery     right  foot  . Cardiac catheterization approx 8 years ago    for chest pain,    History   Social History  . Marital Status: Married    Spouse Name: N/A    Number of Children: 1  . Years of Education: N/A   Occupational History  . Occupational hygienist    Social History Main Topics  . Smoking status: Never Smoker   . Smokeless tobacco: Never Used  . Alcohol Use: Yes     1-2 drinks per month.  . Drug Use: No  . Sexually Active: Yes    Birth Control/ Protection: Other-see comments   Other Topics Concern  . Not on file   Social History Narrative  . No narrative on file   ROS:  Denies fevers, URI symptoms, shortness of breath, chest pain.  +neuro symptoms as per HPI. Some tightness in her left calf, no swelling.  PHYSICAL EXAM: BP 128/72  Pulse 68  Ht 5\' 6"  (1.676 m)  Wt 172 lb (78.019 kg)  BMI 27.76 kg/m2  LMP 09/29/2011 Pleasant female in no distress Tremor L arm, mainly hand shaking, slowly; had resolved during her visit. Some decrease in finger strength L>R, but didn't exhibit full strength on right either Slight hyperreflexic at forearm, normal and symmetric at elbow Skin: no rash  ASSESMENT/PLAN: 1. Tremor    suspect MS   Suspected MS given neuro complaints in a patient with optic neuritis. I spoke with Dr. Terrace Arabia from [facility], who explained that with Optic neuritis-- needs IV steroids, followed by orals (  and risks of worse outcomes if oral steroids started prior to IV). She agreed to get her in for OV within 7-10 days, which is much sooner than [facility] appointment.  Fax demographic data to: [phone] Attn Darreld Mclean

## 2011-10-03 NOTE — Progress Notes (Signed)
Done

## 2011-10-28 ENCOUNTER — Other Ambulatory Visit: Payer: Self-pay | Admitting: Neurology

## 2011-10-28 DIAGNOSIS — G35 Multiple sclerosis: Secondary | ICD-10-CM

## 2011-10-29 ENCOUNTER — Other Ambulatory Visit: Payer: Self-pay

## 2011-10-29 LAB — OB RESULTS CONSOLE GC/CHLAMYDIA
Chlamydia: NEGATIVE
Gonorrhea: NEGATIVE

## 2011-10-29 LAB — OB RESULTS CONSOLE RPR: RPR: NONREACTIVE

## 2011-10-29 LAB — OB RESULTS CONSOLE HIV ANTIBODY (ROUTINE TESTING): HIV: NONREACTIVE

## 2011-10-30 ENCOUNTER — Other Ambulatory Visit: Payer: Managed Care, Other (non HMO)

## 2011-12-25 ENCOUNTER — Observation Stay (HOSPITAL_COMMUNITY)
Admission: AD | Admit: 2011-12-25 | Discharge: 2011-12-29 | Disposition: A | Discharge status: Home / Self Care | Payer: Managed Care, Other (non HMO) | Source: Ambulatory Visit | Attending: Obstetrics and Gynecology | Admitting: Obstetrics and Gynecology

## 2011-12-25 ENCOUNTER — Encounter (HOSPITAL_COMMUNITY): Payer: Self-pay

## 2011-12-25 ENCOUNTER — Inpatient Hospital Stay (HOSPITAL_COMMUNITY): Payer: Managed Care, Other (non HMO)

## 2011-12-25 DIAGNOSIS — O4590 Premature separation of placenta, unspecified, unspecified trimester: Secondary | ICD-10-CM | POA: Insufficient documentation

## 2011-12-25 DIAGNOSIS — O4290 Premature rupture of membranes, unspecified as to length of time between rupture and onset of labor, unspecified weeks of gestation: Principal | ICD-10-CM | POA: Insufficient documentation

## 2011-12-25 DIAGNOSIS — O09519 Supervision of elderly primigravida, unspecified trimester: Secondary | ICD-10-CM | POA: Insufficient documentation

## 2011-12-25 DIAGNOSIS — O42919 Preterm premature rupture of membranes, unspecified as to length of time between rupture and onset of labor, unspecified trimester: Secondary | ICD-10-CM

## 2011-12-25 LAB — URINALYSIS, ROUTINE W REFLEX MICROSCOPIC
Bilirubin Urine: NEGATIVE
Glucose, UA: NEGATIVE mg/dL
Ketones, ur: NEGATIVE mg/dL
Leukocytes, UA: NEGATIVE
Nitrite: NEGATIVE
Protein, ur: 30 mg/dL — AB

## 2011-12-25 LAB — POCT FERN TEST: POCT Fern Test: POSITIVE

## 2011-12-25 LAB — CBC
HCT: 33.7 % — ABNORMAL LOW (ref 36.0–46.0)
Hemoglobin: 10.7 g/dL — ABNORMAL LOW (ref 12.0–15.0)
MCV: 73.9 fL — ABNORMAL LOW (ref 78.0–100.0)
RDW: 17.4 % — ABNORMAL HIGH (ref 11.5–15.5)
WBC: 11.3 10*3/uL — ABNORMAL HIGH (ref 4.0–10.5)

## 2011-12-25 LAB — WET PREP, GENITAL
Trich, Wet Prep: NONE SEEN
Yeast Wet Prep HPF POC: NONE SEEN

## 2011-12-25 MED ORDER — ZOLPIDEM TARTRATE 5 MG PO TABS: 5.0000 mg | ORAL_TABLET | Freq: Every evening | ORAL | Status: DC | PRN

## 2011-12-25 MED ORDER — CALCIUM CARBONATE ANTACID 500 MG PO CHEW: 2.0000 | CHEWABLE_TABLET | ORAL | Status: DC | PRN

## 2011-12-25 MED ORDER — PRENATAL MULTIVITAMIN CH
1.0000 | ORAL_TABLET | Freq: Every day | ORAL | Status: DC
  Administered 2011-12-26 – 2011-12-29 (×4): 1 via ORAL
  Filled 2011-12-25 (×3): qty 1

## 2011-12-25 MED ORDER — DOCUSATE SODIUM 100 MG PO CAPS
100.0000 mg | ORAL_CAPSULE | Freq: Every day | ORAL | Status: DC
  Administered 2011-12-27 – 2011-12-29 (×3): 100 mg via ORAL
  Filled 2011-12-25 (×5): qty 1

## 2011-12-25 MED ORDER — ACETAMINOPHEN 325 MG PO TABS: 650.0000 mg | ORAL_TABLET | ORAL | Status: DC | PRN

## 2011-12-25 NOTE — H&P (Addendum)
Peggy [name] is a 36 y.o. female presenting for leaking fluid  36yo G2P1001 @ 19+0 p/w c/o leaking fluid that started this afternoon around 4:30.  She states she started experiencing some cramping and then had a large gush of fluid and continued to trickle fluid since then.  Her pregnancy has been uncomplicated to this point.  She declined genetic screening. History OB History    Grav Para Term Preterm Abortions TAB SAB Ect Mult Living   2 1  1      1     2009: 37 week Primary c/s for gestational hypertension and breech presentation   Past Medical History  Diagnosis Date  . Fibromyalgia   . Infertility management    Past Surgical History  Procedure Date  . Cesarean section   . Tonsillectomy   . Foot surgery     right foot  . Cardiac catheterization approx 8 years ago    for chest pain,     Family History: family history is not on file. Social History:  reports that she has never smoked. She has never used smokeless tobacco. She reports that she drinks alcohol. She reports that she does not use illicit drugs.   Prenatal Transfer Tool  Maternal Diabetes: Not yet screened Genetic Screening: Declined Maternal Ultrasounds/Referrals: Normal Fetal Ultrasounds or other Referrals:  See report from tonight Maternal Substance Abuse:  No Significant Maternal Medications:  None Significant Maternal Lab Results:  None Other Comments:  None  ROS: as above    Blood pressure 117/78, pulse 87, temperature 98.7 F (37.1 C), temperature source Oral, resp. rate 16, height 5\' 7"  (1.702 m), weight 81.103 kg (178 lb 12.8 oz), SpO2 100.00%. Exam Physical Exam  Prenatal labs: ABO, Rh:   Antibody:   Rubella:   RPR:    HBsAg:    HIV:    GBS:     Number of Fetuses: 1  Heart Rate: 127 bpm  Movement: Present  Presentation: Transverse with head right  Placental Location: Anterior, with large placental abruption at the  superior margin. Abruption measures 8.3 x 10.4 x 8.4 cm,  displacing  the placenta.  Previa: None.  Amniotic Fluid (Subjective): Decreased compatible with premature  rupture of membranes.  Vertical Pocket 2.2 cm AFI 3.7 cm (5%ile = 5.0 cm)  BPD: 4.3 cm 19 w 0 d  MATERNAL FINDINGS:  Cervix: Closed measuring 4.2 cm.  Uterus/Adnexae: Normal.  IMPRESSION:  Intrauterine pregnancy with large placental abruption at the  anterior superior margin of the anterior placenta. No previa.  Oligohydramnios compatible with premature rupture of membranes   Assessment/Plan: 1) Admit to antenatal for observation.  Will obtain a MFM consultation in the morning.  I discussed with the patient and her husband the grim prognosis for this pregnancy given PPROM remote from viability.  Discussed management options of expectant management, induction of labor, or D&E.  Discussed risks of development of infection, labor, further abruption, IUFD, possible maternal sepsis and death. The patient and her husband understand the prognosis of their situation is poor but need some time to process and welcome the input of MFM. 2) They understand that if there is any evidence of infection, worsening bleeding, or fetal demise then the management with change.  Melita Villalona H. 12/25/2011, 9:19 PM

## 2011-12-25 NOTE — MAU Provider Note (Signed)
History     CSN: [id number]  Arrival date and time: 12/25/11 1746   First Provider Initiated Contact with Patient 12/25/11 1852      Chief Complaint  Patient presents with  . Vaginal Discharge  . Vaginal Bleeding  . Abdominal Pain   HPI Peggy [name] 36 y.o. [redacted]w[redacted]d Comes to MAU with pink spotting that began around 3:30 to 4:30 pm today.  Then began having leaking which has continued.  Began having some lower abdominal cramping about 1 hour ago.  Called the office and was advised to come to MAU for evaluation.  History of one birth at 59 weeks.  OB History    Grav Para Term Preterm Abortions TAB SAB Ect Mult Living   2 1  1      1       Past Medical History  Diagnosis Date  . Fibromyalgia   . Infertility management     Past Surgical History  Procedure Date  . Cesarean section   . Tonsillectomy   . Foot surgery     right foot  . Cardiac catheterization approx 8 years ago    for chest pain,     History reviewed. No pertinent family history.  History  Substance Use Topics  . Smoking status: Never Smoker   . Smokeless tobacco: Never Used  . Alcohol Use: Yes     Comment: 1-2 drinks per month.    Allergies: No Known Allergies  Prescriptions prior to admission  Medication Sig Dispense Refill  . acetaminophen (TYLENOL) 325 MG tablet Take 650 mg by mouth every 6 (six) hours as needed. For headaches.      . Prenatal Vit-Fe Fumarate-FA (PRENATAL MULTIVITAMIN) TABS Take 1 tablet by mouth daily.        Review of Systems  Constitutional: Negative for fever.  Gastrointestinal: Positive for abdominal pain. Negative for nausea, vomiting, diarrhea and constipation.  Genitourinary: Negative for dysuria.       Vaginal spotting Leaking of fluid from the vagina   Physical Exam   Blood pressure 117/78, pulse 87, temperature 98.7 F (37.1 C), temperature source Oral, resp. rate 16, height 5\' 7"  (1.702 m), weight 81.103 kg (178 lb 12.8 oz), SpO2 100.00%.  Physical Exam   Nursing note and vitals reviewed. Constitutional: She is oriented to person, place, and time. She appears well-developed and well-nourished.  HENT:  Head: Normocephalic.  Eyes: EOM are normal.  Neck: Neck supple.  GI: Soft. There is no tenderness. There is no rebound and no guarding.       FHT 136 with doppler  Genitourinary:       Speculum exam: Vulva - fluid seen running down perineum, pad to perineum about 50% saturated with clear fluid which has the odor of amniotic fluid Vagina - Small amount of creamy discharge, no odor Cervix - No contact bleeding Bimanual exam: deferred GC/Chlam, wet prep, GBS, Fern slide done Chaperone present for exam.  Musculoskeletal: Normal range of motion.  Neurological: She is alert and oriented to person, place, and time.  Skin: Skin is warm and dry.  Psychiatric: She has a normal mood and affect.    MAU Course  Procedures  MDM 1915  Consult with Dr. Tenny Craw re: plan of care 2000 Care assumed by L. Leftwich-Kirby  Assessment and Plan    BURLESON,TERRI 12/25/2011, 7:15 PM   U/S results indicate large subchorionic hemorrhage and low AFI indicative of PPROM  Called Dr Tenny Craw and she is on her way to speak  with pt about results/options/plan of care  Sharen Counter Certified Nurse-Midwife

## 2011-12-25 NOTE — MAU Note (Signed)
Patient states she started having light spotting at 1530, has had clear fluid with pink tinge leaking uncontrollably since 1630 and started having sharp abdominal pain about 30 minutes ago.

## 2011-12-26 ENCOUNTER — Other Ambulatory Visit (HOSPITAL_COMMUNITY): Payer: Self-pay | Admitting: Radiology

## 2011-12-26 ENCOUNTER — Encounter (HOSPITAL_COMMUNITY): Payer: Managed Care, Other (non HMO)

## 2011-12-26 ENCOUNTER — Ambulatory Visit (HOSPITAL_COMMUNITY): Payer: Managed Care, Other (non HMO)

## 2011-12-26 LAB — PREPARE RBC (CROSSMATCH)

## 2011-12-26 LAB — ABO/RH: ABO/RH(D): A POS

## 2011-12-26 LAB — GC/CHLAMYDIA PROBE AMP: GC Probe RNA: NEGATIVE

## 2011-12-26 NOTE — Progress Notes (Signed)
36 y.o. G2P0101 [redacted]w[redacted]d HD#1 admitted for 19 weeks SROM and bleeding.  Pt has had no bleeding or LOF overnight into today.    Pt currently stable with no c/o ctxes or pain.  Good FM.  Filed Vitals:   12/26/11 0000 12/26/11 0530 12/26/11 0600 12/26/11 0700  BP:  114/72    Pulse:  91    Temp:  98.1 F (36.7 C)    TempSrc:  Oral    Resp: 18 18 18 18   Height:      Weight:      SpO2:        Lungs CTA Cor RRR Abd  Soft, gravid, nontender Ex SCDs FHTs  150s this am. Toco  occ  Results for orders placed during the hospital encounter of 12/25/11 (from the past 24 hour(s))  URINALYSIS, ROUTINE W REFLEX MICROSCOPIC     Status: Abnormal   Collection Time   12/25/11  6:05 PM      Component Value Range   Color, Urine STRAW (*) YELLOW   APPearance CLEAR  CLEAR   Specific Gravity, Urine <1.005 (*) 1.005 - 1.030   pH 6.0  5.0 - 8.0   Glucose, UA NEGATIVE  NEGATIVE mg/dL   Hgb urine dipstick LARGE (*) NEGATIVE   Bilirubin Urine NEGATIVE  NEGATIVE   Ketones, ur NEGATIVE  NEGATIVE mg/dL   Protein, ur 30 (*) NEGATIVE mg/dL   Urobilinogen, UA 0.2  0.0 - 1.0 mg/dL   Nitrite NEGATIVE  NEGATIVE   Leukocytes, UA NEGATIVE  NEGATIVE  URINE MICROSCOPIC-ADD ON     Status: Abnormal   Collection Time   12/25/11  6:05 PM      Component Value Range   Squamous Epithelial / LPF FEW (*) RARE   WBC, UA 0-2  <3 WBC/hpf   RBC / HPF 3-6  <3 RBC/hpf  WET PREP, GENITAL     Status: Abnormal   Collection Time   12/25/11  7:08 PM      Component Value Range   Yeast Wet Prep HPF POC NONE SEEN  NONE SEEN   Trich, Wet Prep NONE SEEN  NONE SEEN   Clue Cells Wet Prep HPF POC NONE SEEN  NONE SEEN   WBC, Wet Prep HPF POC RARE (*) NONE SEEN  POCT FERN TEST     Status: Normal   Collection Time   12/25/11  7:23 PM      Component Value Range   POCT Fern Test Positive = ruptured amniotic membanes    CBC     Status: Abnormal   Collection Time   12/25/11  7:35 PM      Component Value Range   WBC 11.3 (*) 4.0 -  10.5 K/uL   RBC 4.56  3.87 - 5.11 MIL/uL   Hemoglobin 10.7 (*) 12.0 - 15.0 g/dL   HCT 66.4 (*) 40.3 - 47.4 %   MCV 73.9 (*) 78.0 - 100.0 fL   MCH 23.5 (*) 26.0 - 34.0 pg   MCHC 31.8  30.0 - 36.0 g/dL   RDW 25.9 (*) 56.3 - 87.5 %   Platelets 274  150 - 400 K/uL  PREPARE RBC (CROSSMATCH)     Status: Normal   Collection Time   12/25/11 11:00 PM      Component Value Range   Order Confirmation ORDER PROCESSED BY BLOOD BANK    TYPE AND SCREEN     Status: Normal (Preliminary result)   Collection Time   12/25/11 11:08 PM  Component Value Range   ABO/RH(D) [name] POS     Antibody Screen NEG     Sample Expiration 12/28/2011     Unit Number [id number]     Blood Component Type RED CELLS,LR     Unit division 00     Status of Unit ALLOCATED     Transfusion Status OK TO TRANSFUSE     Crossmatch Result Compatible     Unit Number [id number]     Blood Component Type RED CELLS,LR     Unit division 00     Status of Unit ALLOCATED     Transfusion Status OK TO TRANSFUSE     Crossmatch Result Compatible    ABO/RH     Status: Normal   Collection Time   12/25/11 11:08 PM      Component Value Range   ABO/RH(D) [name] POS      [name]:  HD#1  [redacted]w[redacted]d with PPROM and abruption.  P: MFM consult pending.   Pt has T&S but stable H/H and no bleeding.  Peggy [name]

## 2011-12-26 NOTE — Consult Note (Signed)
MFM Note  Peggy [name] is a 36 year old G2P1 Caucasian female at 19+1 weeks who presented yesterday afternoon with a history of leaking clear fluid with vaginal spotting and occasional mild abdominal pain. Her prenatal course had been uneventful until now. However, she was in the mist of a work-up for MS and was to be treated with IV steroids for optic neuritis when the pregnancy was discovered. Since admission, she reports very little leaking or bleeding with good fetal movement.  OB history: 2009, primary C/S for PIH and breech presentation, no antenatal complications   Ultrasound revealed oligohydramnios and a large placental abruption  Labs: fern positive; WBC 11,300; H/H 10.7/33.7  Assessment: 1) IUP at 19+ weeks 2) PPROM 3) Large placental abruption 4) S/P C/S 5) Optic neuritis and other symptoms of MS 6) AMA  We had a lengthy discussion regarding the implications of PPROM and abruption at [redacted] weeks gestation. Maternal risks include intrauterine infection leading to sepsis and death and also, continuation of intrauterine and/or vaginal bleeding from the abruption leading to anemia and possible DIC. Fetal implications of PPROM and abruption at 19 weeks include pulmonary hypoplasia, skeletal deformation, IUFD, fetal growth restriction, previable delivery or a very preterm birth with all the associated complications of prematurity. I feel like there is very little chance for the Allens to have a good outcome. She most likely deliver before viability. She was offered expectant management and termination of the pregnancy by induction or D&E. Peggy [name] and her husband needed some time to discuss their situation.  Recs: 1) Continue in house observation for a few more days 2) Then if stable, could consider going home for a few weeks with strict precautions 3) Hospitalization at ~23-26 weeks for BMZ and in house management until delivery 4) Ultrasound at [facility] prior to discharge 5) Delivery with  infection, hemorrhage or IUFD  Thank you for allowing [place] to be apart of Peggy [name]'s care. Please do not hesitate to call with any questions or concerns.  (Face-to-face consultation with patient: 30 min)

## 2011-12-27 NOTE — Progress Notes (Signed)
Patient doing well.  Minimal bleeding, leaking, and cramping.  At this time she is leaning towards allowing the pregnancy to continue.  We discussed that after 20 weeks of pregnancy (22 weeks from LMP) she would not have an option to abort the pregnancy unless her health was threatened by infection.

## 2011-12-27 NOTE — Progress Notes (Signed)
I visited with Ms Peggy [name] at the recommendation of her nurse.  She is coping as well as can be expected and they are taking things one day at a time.  They have good family support and she is trying to adjust to being in the hospital.  They had some medical questions which I passed on to her nurse.  [employer] Pager, [phone] 2:46 PM

## 2011-12-28 LAB — OB RESULTS CONSOLE GBS: GBS: NEGATIVE

## 2011-12-28 LAB — CULTURE, BETA STREP (GROUP B ONLY)

## 2011-12-28 NOTE — Progress Notes (Signed)
S:  Pt. feels fine.  Denies bleeding, leaking, or cramps.  She is reluctant to leave the hospital.  O:  Afebrile, Vital signs are stable.  A:  Pre-viable pregnancy.  Mother appears stable with no evidence of infection and no significant bleeding or uterine pain.  P:  I explained that I did not feel she was in danger by leaving the hospital at this time.  Told her that severe hemorrhage at home was very unlikely.  Told her that the main risk was infection and that she would have to carefully monitor her temperature and call for the first signs of persistent fevers.  I also explained that we could not save the baby at this point in the pregnancy.  This would change at 23 weeks and that is why we would readmit her at that time.  She and her husband will consider this information and let me know if they would reconsider being discharged today.

## 2011-12-29 LAB — TYPE AND SCREEN: Unit division: 0

## 2011-12-29 NOTE — Discharge Summary (Signed)
Physician Discharge Summary  Patient ID: AIZLYNN DIGILIO MRN: [id number] DOB/AGE: Dec 25, 1975 37 y.o.  Admit date: 12/25/2011 Discharge date: 12/29/2011  Admission Diagnoses:  PPROM and Partial Placental Abruption  Discharge Diagnoses: PPROM and Partial Placental Abruption Active Problems:  * No active hospital problems. *    Discharged Condition: stable  Hospital Course: Admitted with PPROM and partial abruption confirmed with ultrasound at [redacted] weeks gestation.  Patient observed over 4 days with no evidence of infection.  Living pregnancy is present.  Leaking and bleeding is minimal at discharge.  Consults: Particia Nearing, MFM  Significant Diagnostic Studies: radiology: Ultrasound: Oligohydramnios, large placental abruption  Treatments: Observation  Discharge Exam: Blood pressure 103/65, pulse 85, temperature 97.9 F (36.6 C), temperature source Oral, resp. rate 20, height 5\' 7"  (1.702 m), weight 178 lb (80.74 kg), SpO2 100.00%. GI: soft, non-tender; bowel sounds normal; no masses,  no organomegaly  Disposition: 01-Home or Self Care  Discharge Orders    Future Appointments: Provider: Department: Dept Phone: Center:   01/01/2012 2:45 PM Jac Canavan, PA [facility] [phone] [facility]       Medication List     As of 12/29/2011 10:23 AM    TAKE these medications         acetaminophen 325 MG tablet   Commonly known as: TYLENOL   Take 650 mg by mouth every 6 (six) hours as needed. For headaches.      prenatal multivitamin Tabs   Take 1 tablet by mouth daily.           Follow-up Information    Follow up with Almon Hercules., MD. Schedule an appointment as soon as possible for a visit on 01/16/2012. (Schedule Ultrasound)    Contact information:   [address] [place] [address] [phone]          Signed: Mickel Baas 12/29/2011, 10:23 AM

## 2012-01-01 ENCOUNTER — Encounter: Payer: Managed Care, Other (non HMO) | Admitting: Medical

## 2012-01-09 ENCOUNTER — Other Ambulatory Visit (HOSPITAL_COMMUNITY): Payer: Self-pay | Admitting: Obstetrics and Gynecology

## 2012-01-09 DIAGNOSIS — O4590 Premature separation of placenta, unspecified, unspecified trimester: Secondary | ICD-10-CM

## 2012-01-16 ENCOUNTER — Ambulatory Visit (HOSPITAL_COMMUNITY)
Admission: RE | Admit: 2012-01-16 | Discharge: 2012-01-16 | Disposition: A | Discharge status: Home / Self Care | Payer: Managed Care, Other (non HMO) | Source: Ambulatory Visit | Attending: Obstetrics & Gynecology | Admitting: Obstetrics & Gynecology

## 2012-01-16 ENCOUNTER — Encounter (HOSPITAL_COMMUNITY): Payer: Self-pay

## 2012-01-16 ENCOUNTER — Other Ambulatory Visit (HOSPITAL_COMMUNITY): Payer: Self-pay | Admitting: Obstetrics and Gynecology

## 2012-01-16 VITALS — BP 108/69 | HR 100 | Wt 174.0 lb

## 2012-01-16 DIAGNOSIS — O3410 Maternal care for benign tumor of corpus uteri, unspecified trimester: Secondary | ICD-10-CM | POA: Insufficient documentation

## 2012-01-16 DIAGNOSIS — O4290 Premature rupture of membranes, unspecified as to length of time between rupture and onset of labor, unspecified weeks of gestation: Secondary | ICD-10-CM | POA: Insufficient documentation

## 2012-01-16 DIAGNOSIS — O4100X0 Oligohydramnios, unspecified trimester, not applicable or unspecified: Secondary | ICD-10-CM | POA: Insufficient documentation

## 2012-01-16 DIAGNOSIS — Z1389 Encounter for screening for other disorder: Secondary | ICD-10-CM | POA: Insufficient documentation

## 2012-01-16 DIAGNOSIS — O4590 Premature separation of placenta, unspecified, unspecified trimester: Secondary | ICD-10-CM

## 2012-01-16 DIAGNOSIS — Z363 Encounter for antenatal screening for malformations: Secondary | ICD-10-CM | POA: Insufficient documentation

## 2012-01-16 DIAGNOSIS — O34219 Maternal care for unspecified type scar from previous cesarean delivery: Secondary | ICD-10-CM | POA: Insufficient documentation

## 2012-01-16 DIAGNOSIS — O358XX0 Maternal care for other (suspected) fetal abnormality and damage, not applicable or unspecified: Secondary | ICD-10-CM | POA: Insufficient documentation

## 2012-01-16 NOTE — Progress Notes (Signed)
Peggy [name]  was seen today for an ultrasound appointment.  See full report in [facility].  Comments: [name] returns for evaluation.  She is a 37 yo G2P1 currently at 20 1/7 weeks - was admitted at 19 weeks due to PPROM and suspected abruption.  She has been managed as an outpatient with frequent temperature checks.  She denies fevers or abdominal pain - no obvious signs of intraamniotic infection.  The couple is aware of the poor prognosis for the fetus including risk of pulmonary hypoplasia, deformations, infection and preterm/ previable delivery.  After our discussion, the patient would like to be readmitted at 23-24 weeks for betamethasone, latency antibiotics and inpatient management.  Would also recommend NICU consult at that time.  Impression: Single IUP at 22 1/7 weeks Oligohydramnios is noted with a single fluid pocket of 2.8 cm Kidneys and bladder visualized.  A small stomach was noted. Somewhat limited views of the heart and face were obtained due to oligohydramnios No obvious retroplacental or subchorionic fluid collections noted  Recommendations: Recommend follow up ultrasound next week (limited) Plan inpatient management and betamethsone series at that time.  Alpha Gula, MD

## 2012-01-17 ENCOUNTER — Other Ambulatory Visit: Payer: Self-pay

## 2012-01-24 ENCOUNTER — Ambulatory Visit (HOSPITAL_COMMUNITY)
Admission: RE | Admit: 2012-01-24 | Discharge: 2012-01-24 | Disposition: A | Discharge status: Home / Self Care | Payer: Managed Care, Other (non HMO) | Source: Ambulatory Visit | Attending: Obstetrics & Gynecology | Admitting: Obstetrics & Gynecology

## 2012-01-24 ENCOUNTER — Inpatient Hospital Stay (HOSPITAL_COMMUNITY)
Admission: AD | Admit: 2012-01-24 | Discharge: 2012-03-03 | DRG: 765 | Disposition: A | Discharge status: Home / Self Care | Payer: Managed Care, Other (non HMO) | Source: Ambulatory Visit | Attending: Obstetrics and Gynecology | Admitting: Obstetrics and Gynecology

## 2012-01-24 ENCOUNTER — Encounter (HOSPITAL_COMMUNITY): Payer: Self-pay | Admitting: *Deleted

## 2012-01-24 DIAGNOSIS — O09529 Supervision of elderly multigravida, unspecified trimester: Secondary | ICD-10-CM | POA: Diagnosis present

## 2012-01-24 DIAGNOSIS — O4290 Premature rupture of membranes, unspecified as to length of time between rupture and onset of labor, unspecified weeks of gestation: Secondary | ICD-10-CM | POA: Diagnosis present

## 2012-01-24 DIAGNOSIS — O4590 Premature separation of placenta, unspecified, unspecified trimester: Secondary | ICD-10-CM

## 2012-01-24 DIAGNOSIS — Z1389 Encounter for screening for other disorder: Secondary | ICD-10-CM | POA: Insufficient documentation

## 2012-01-24 DIAGNOSIS — O358XX0 Maternal care for other (suspected) fetal abnormality and damage, not applicable or unspecified: Secondary | ICD-10-CM | POA: Insufficient documentation

## 2012-01-24 DIAGNOSIS — O34219 Maternal care for unspecified type scar from previous cesarean delivery: Secondary | ICD-10-CM | POA: Insufficient documentation

## 2012-01-24 DIAGNOSIS — O4100X0 Oligohydramnios, unspecified trimester, not applicable or unspecified: Secondary | ICD-10-CM | POA: Insufficient documentation

## 2012-01-24 DIAGNOSIS — Z363 Encounter for antenatal screening for malformations: Secondary | ICD-10-CM | POA: Insufficient documentation

## 2012-01-24 DIAGNOSIS — O3410 Maternal care for benign tumor of corpus uteri, unspecified trimester: Secondary | ICD-10-CM | POA: Insufficient documentation

## 2012-01-24 LAB — CBC
MCHC: 32.1 g/dL (ref 30.0–36.0)
MCV: 77.8 fL — ABNORMAL LOW (ref 78.0–100.0)
Platelets: 267 10*3/uL (ref 150–400)
RDW: 18.6 % — ABNORMAL HIGH (ref 11.5–15.5)
WBC: 10.6 10*3/uL — ABNORMAL HIGH (ref 4.0–10.5)

## 2012-01-24 LAB — OB RESULTS CONSOLE ABO/RH

## 2012-01-24 MED ORDER — DOCUSATE SODIUM 100 MG PO CAPS
100.0000 mg | ORAL_CAPSULE | Freq: Every day | ORAL | Status: DC
  Administered 2012-01-25 – 2012-01-28 (×3): 100 mg via ORAL
  Administered 2012-01-29: 200 mg via ORAL
  Administered 2012-01-30 – 2012-02-29 (×30): 100 mg via ORAL
  Filled 2012-01-24 (×35): qty 1

## 2012-01-24 MED ORDER — ACETAMINOPHEN 325 MG PO TABS
650.0000 mg | ORAL_TABLET | ORAL | Status: DC | PRN
  Administered 2012-02-11 – 2012-02-26 (×2): 650 mg via ORAL
  Filled 2012-01-24 (×2): qty 2

## 2012-01-24 MED ORDER — CALCIUM CARBONATE ANTACID 500 MG PO CHEW
2.0000 | CHEWABLE_TABLET | ORAL | Status: DC | PRN
  Administered 2012-02-10: 400 mg via ORAL
  Filled 2012-01-24: qty 2

## 2012-01-24 MED ORDER — BETAMETHASONE SOD PHOS & ACET 6 (3-3) MG/ML IJ SUSP
12.0000 mg | INTRAMUSCULAR | Status: AC
  Administered 2012-01-24 – 2012-01-25 (×2): 12 mg via INTRAMUSCULAR
  Filled 2012-01-24 (×2): qty 2

## 2012-01-24 MED ORDER — ZOLPIDEM TARTRATE 5 MG PO TABS
5.0000 mg | ORAL_TABLET | Freq: Every evening | ORAL | Status: DC | PRN
  Filled 2012-01-24: qty 1

## 2012-01-24 MED ORDER — PRENATAL MULTIVITAMIN CH
1.0000 | ORAL_TABLET | Freq: Every day | ORAL | Status: DC
  Administered 2012-01-25 – 2012-02-29 (×34): 1 via ORAL
  Filled 2012-01-24 (×34): qty 1

## 2012-01-24 NOTE — ED Notes (Signed)
Patient describes old brownish discharge, good fetal movement, and denies any fever at home.

## 2012-01-24 NOTE — H&P (Signed)
Peggy [name] is a 37 y.o. female presenting for Steroids ands hospital bedrest for PPROM  37 yo G2P0101 @ 23+2 presents for readmission for PPROM.  The patient was initially seen on 12/25/2011 for leaking fluid and confirmed to be PPROM.  An anterior placental abruption measuring 8.3 x 10.4 x 8.4 cm was noted at that time and the AFI was 3.7 and the fetus was viable.  She was admitted for a brief time and saw MFM.  The plan to D/C home and readmit at 23-24 weeks was agreed upon.  She has been seeing MFM weekly and was also supposed to follow up in our office but did not do so.  She is admitted for antenatal steroids and bed rest until delivery.  She has been counseled about the poor prognosis for the fetus given gestational age or PPROM, probability of pulmonary hypoplasia, possibility of IUFD, Chorioamnionitis, placental abruption.   History OB History    Grav Para Term Preterm Abortions TAB SAB Ect Mult Living   2 1  1      1      Past Medical History  Diagnosis Date  . Fibromyalgia   . Infertility management    Past Surgical History  Procedure Date  . Cesarean section   . Tonsillectomy   . Foot surgery     right foot  . Cardiac catheterization approx 8 years ago    for chest pain,    Family History: family history includes Diabetes in her maternal grandmother and Heart disease in her maternal grandfather and maternal grandmother. Social History:  reports that she has never smoked. She has never used smokeless tobacco. She reports that she drinks alcohol. She reports that she does not use illicit drugs.   Prenatal Transfer Tool  Maternal Diabetes: No, too early Genetic Screening: Declined Maternal Ultrasounds/Referrals: Abnormal:  Findings:   Other: Aoligohydramnios.  Normal anatomy Fetal Ultrasounds or other Referrals:  Referred to Materal Fetal Medicine  Maternal Substance Abuse:  No Significant Maternal Medications:  None Significant Maternal Lab Results:  None Other Comments:   None  ROS: as above    Blood pressure 107/70, pulse 102, temperature 98.2 F (36.8 C), temperature source Oral, resp. rate 20, height 5\' 7"  (1.702 m), weight 79.379 kg (175 lb), last menstrual period 09/29/2011. Exam Physical Exam  Prenatal labs: ABO, Rh: --/--/A POS, A POS (12/11 2308) Antibody: NEG (12/11 2308) Rubella:   RPR:    HBsAg:    HIV:    GBS:     Assessment/Plan: 1) Admit to antenatal 2) BMZ for enhancement of FLM 3) NICU consult 4) bedrest with bathroom privledges  Peggy [name] H. 01/24/2012, 1:30 PM

## 2012-01-25 NOTE — Progress Notes (Signed)
Patient ID: Peggy [name], female   DOB: March 24, 1975, 37 y.o.   MRN: [id number]  S: No changes O: AFVSS FHT 155 toco off A/P 1) 2nd BMZ today 2) Hospital Bedrest

## 2012-01-26 NOTE — Consult Note (Signed)
Neonatology Consult to Antenatal Patient:  Peggy [name] was admitted 1/10 and is now at 23 4/[redacted] weeks GA. She had PPROM since 19 weeks and has had oligohydramnios, with the most recent AFI about 1.5. She is currently not having active labor and has remained afebrile. She got 2 doses of BMZ on 1/10-11. The infant is female and is her second child.  I spoke with the patient alone. I explained the importance of adequate amniotic fluid volume to lung development first. Then, I gave her information about outcomes for infants born at 23-24 weeks WITH adequate amniotic fluid volumes; chance of survival 10-20% with very high risk for poor neurodevelopmental delays. I let her know that, in the presence of prolonged oligohydramnios, the baby's chance for survival would be even lower. She would like full resuscitative efforts made, so we discussed the worst case of delivery in the next 1-2 days, including usual DR management,  respiratory complications and need for support, IV access, feedings (mother desires breast feeding, which was encouraged), LOS, Mortality and Morbidity, and long term outcomes. She had a few questions, which I answered. I offered a NICU tour to any interested family members and would be glad to come back if she has more questions later.  Thank you for asking me to see this patient.  Deatra James, MD Neonatologist  Time spent: 25 minutes

## 2012-01-26 NOTE — Progress Notes (Signed)
Patient ID: Peggy [name], female   DOB: 1975/06/25, 37 y.o.   MRN: [id number]  S: Doing well without complaint.  Temp increased to 99.2 yesterday but patient denied abdominal pain. O: Filed Vitals:   01/25/12 1700 01/25/12 1800 01/25/12 1857 01/25/12 1938  BP:    119/76  Pulse:    104  Temp:    98.9 F (37.2 C)  TempSrc:    Oral  Resp: 18 18 18 18   Height:      Weight:       AOx3 Gravid soft NT Cvx deferred Toco prn FHT 138 by doppler  U/S: 1/10 Oligo AFI 1.8, Cephalic presentation  A/P: 37 yo G2P0101 @ 23+4, PPROM @ 19 wks 1) Now admitted for hospital bedrest 2) BMZ 1/10 & 1/11 3) NICU consult 4) SCDs for DVT prophylaxis

## 2012-01-27 LAB — TYPE AND SCREEN
Antibody Screen: NEGATIVE
Unit division: 0

## 2012-01-27 MED ORDER — FAMOTIDINE 20 MG PO TABS
20.0000 mg | ORAL_TABLET | Freq: Two times a day (BID) | ORAL | Status: DC
  Administered 2012-01-28 – 2012-02-29 (×65): 20 mg via ORAL
  Filled 2012-01-27 (×66): qty 1

## 2012-01-27 NOTE — Progress Notes (Signed)
Pt states that the baby is moving well. She denies any leakage per vagina. She has no F/C. VSSAF IMP/ no change is status. Plan/ Continue with current plan.

## 2012-01-27 NOTE — Progress Notes (Signed)
Ur chart review completed.  

## 2012-01-28 NOTE — Progress Notes (Signed)
Patient doing well.  No fever or tachycardia.  No leaking at this time.  No complaints.

## 2012-01-29 NOTE — Progress Notes (Signed)
37 y.o. G2P0101 [redacted]w[redacted]d HD#5 admitted for 24WKS, ROM.  Pt currently stable with no c/o ctxes or pain.  Good FM.  Filed Vitals:   01/28/12 1210 01/28/12 1619 01/28/12 2000 01/28/12 2210  BP: 108/73 102/56 104/65   Pulse: 86 76 92 95  Temp: 97.8 F (36.6 C) 98.2 F (36.8 C) 98.4 F (36.9 C)   TempSrc: Oral Oral Oral   Resp: 18 18 16    Height:      Weight:      SpO2:    96%    Lungs CTA Cor RRR Abd  Soft, gravid, nontender Ex SCDs FHTs  present Toco  none  No results found for this or any previous visit (from the past 24 hour(s)).  A:  HD#5  [redacted]w[redacted]d with PPROM at 19 weeks. No s/s of infection. P: Continue current.  Last U/S on 1-10- oligo with minimal anatomy seen.    Peggy [name] A

## 2012-01-30 NOTE — Progress Notes (Signed)
Antenatal Nutrition Assessment:  Currently  24 1/[redacted] weeks gestation, with PROM at 19 weeks. Height  67" Weight 175 Lbs pre-pregnancy weight 172 Lbs.Pre-pregnancy  BMI 26.9  IBW 135 Lbs  Total weight gain 3 Lbs. Weight gain goals 15-25 Lbs.   Estimated needs: 19-2100 kcal/day, 72-82  grams protein/day, 2.2 liters fluid/day  Regular diet tolerated well, appetite good. Assures me she is eating 3 good meals per day and appetite is not the reason for minimal weight gain. Requests snacks. Diet order changed to antenatal regular to allow snacks Current diet prescription will provide for increased needs.  No abnormal nutrition related labs  Nutrition Dx: Increased nutrient needs r/t pregnancy and fetal growth requirements aeb [redacted] weeks gestation.  No educational needs assessed at this time.  Peggy [name] M.Odis Luster LDN Neonatal Nutrition Support Specialist Pager [phone]

## 2012-01-30 NOTE — Progress Notes (Signed)
Spoke with Dr Claiborne Billings on the phone about why this pt does not have an IV due to being a High Postpartum Hem Risk, that pt is High risk due to having a previous c/s and history of fibroids, pt currently has no vaginal bleeding; leaking of fluid, asymptomatic. Dr Claiborne Billings acknowledged and stated that she feels that the pt did not need an IV started tonight.

## 2012-01-30 NOTE — Progress Notes (Signed)
HD#6 with PPROM @ 19wks, now readmitted and 24.1. Patient denies any complaints, in good spirits, denies fever/chills/ abd tenderness.  Eating/drinking/urinating without problem.  Denies LOF/VB/ctx.  Good FM.  Filed Vitals:   01/29/12 2100 01/29/12 2200 01/29/12 2300 01/30/12 0822  BP:    101/74  Pulse:    84  Temp:    97.9 F (36.6 C)  TempSrc:    Oral  Resp: 18 20 18 18   Height:      Weight:      SpO2:        Abd: soft, NT Ext: no CT FHT: present- nl baseline TOCO: none SVE: deferred   Lab Results  Component Value Date   WBC 10.6* 01/24/2012   HGB 11.7* 01/24/2012   HCT 36.4 01/24/2012   MCV 77.8* 01/24/2012   PLT 267 01/24/2012    --/--/A POS (01/10 1410)  A/P HD#6 PPROM @19wks . Persistent oligo at last [place] 1/10.  S/p NICU discussion about FLM and prognosis. S/p beta x 2 Cont other Routine care.    Philip Aspen

## 2012-01-31 NOTE — Progress Notes (Signed)
HD# 7 Pt has no complaints. Good FM. No contractions or vaginal drainage. VSSAF Abd- soft non tender. IMP/ No change in status. PLAN/ Will check CBC on Sunday.

## 2012-02-01 NOTE — Progress Notes (Signed)
HD#8 Pt has no complaints. She denies bleeding, leakage of fluid. Good FM. IMP/ Stable PLAN/ Will check CBC and Ultrasound on Monday.

## 2012-02-02 NOTE — Progress Notes (Signed)
HD#9 Pt states that nothing has changed. Still with normal movement. Still with no drainage. Good FM. VSSAF IMP/ Stable PLAN/ Check CBC in AM.

## 2012-02-03 ENCOUNTER — Other Ambulatory Visit (HOSPITAL_COMMUNITY): Payer: Managed Care, Other (non HMO)

## 2012-02-03 LAB — CBC
Hemoglobin: 10.6 g/dL — ABNORMAL LOW (ref 12.0–15.0)
MCH: 24.7 pg — ABNORMAL LOW (ref 26.0–34.0)
RBC: 4.29 MIL/uL (ref 3.87–5.11)

## 2012-02-03 NOTE — Progress Notes (Signed)
No problems.  Repeat ultrasound tomorrow.

## 2012-02-04 ENCOUNTER — Inpatient Hospital Stay (HOSPITAL_COMMUNITY): Payer: Managed Care, Other (non HMO)

## 2012-02-04 NOTE — Plan of Care (Signed)
Problem: Consults Goal: Birthing Suites Patient Information Press F2 to bring up selections list   Pt < [redacted] weeks EGA     

## 2012-02-04 NOTE — Progress Notes (Signed)
HD#11 PPROM @ 19wks with readmission for steroids at 23+wks Pt stable. Denies LOF, bleeding, ctx.  Denies abd tenderness, fever, CP/SOB or other complaints.  Filed Vitals:   02/03/12 2015 02/03/12 2100 02/03/12 2200 02/04/12 0753  BP:    130/76  Pulse:    112  Temp:    98.2 F (36.8 C)  TempSrc:    Oral  Resp: 18 18 18 18   Height:      Weight:      SpO2:        Abd: soft, gravid, NT Ext: no CT US performed today: good interval growth, normal interval anatomy, oligohydramnios but improved to 4.51cm  Lab Results  Component Value Date   WBC 12.1* 02/03/2012   HGB 10.6* 02/03/2012   HCT 33.7* 02/03/2012   MCV 78.6 02/03/2012   PLT 281 02/03/2012    A/P HD#11  PPROM, now 24.6 Continue current mgmt, next [place] 3 wks.  Routine care.    Philip Aspen

## 2012-02-05 LAB — PREPARE RBC (CROSSMATCH)

## 2012-02-05 NOTE — Progress Notes (Signed)
  HD#12  PPROM @ 19 wks, readmitted at 23+ for steroids. Now 25+0  S: Doing well without complaint O: Filed Vitals:   02/05/12 0820  BP: 112/71  Pulse: 89  Temp: 98.3 F (36.8 C)  Resp: 18   Abd soft  FHTs 140 Cvx deferred  [place] s=d, AFI 4.5  A/P 1) Continue hospital bedrest 2) S/P BMZ x 2

## 2012-02-06 NOTE — Progress Notes (Signed)
Pt co's of nausea at this time but desire no meds

## 2012-02-06 NOTE — Progress Notes (Signed)
Enc pt to change position to lye on sides and get off back and to call if occurs again

## 2012-02-06 NOTE — Progress Notes (Signed)
HD#13 Pt states that she noticed some increased pressure last pm. Denies contractions or vaginal drainage. Good FM. Plan/ If pressure persists will check cervix.

## 2012-02-07 MED ORDER — SALINE SPRAY 0.65 % NA SOLN
1.0000 | NASAL | Status: DC | PRN
  Administered 2012-02-07: 1 via NASAL
  Filled 2012-02-07: qty 44

## 2012-02-07 NOTE — Progress Notes (Signed)
Feels fine today.  No leaking.  Discussed case with Dr. Sherrie George, MFM.  Will repeat ultrasound 1/27.  If AFI is around 5 and patient denies leaking, will consider outpatient care.

## 2012-02-08 LAB — TYPE AND SCREEN
ABO/RH(D): A POS
Antibody Screen: NEGATIVE

## 2012-02-08 NOTE — Progress Notes (Signed)
Stable, no leaking.

## 2012-02-09 LAB — TYPE AND SCREEN
Antibody Screen: NEGATIVE
Unit division: 0

## 2012-02-09 NOTE — Progress Notes (Signed)
Stable.  For consult with MFM tomorrow to evaluate for out-patient management.

## 2012-02-10 ENCOUNTER — Encounter (HOSPITAL_COMMUNITY): Payer: Self-pay | Admitting: Radiology

## 2012-02-10 ENCOUNTER — Inpatient Hospital Stay (HOSPITAL_COMMUNITY): Payer: Managed Care, Other (non HMO)

## 2012-02-10 LAB — CBC
Platelets: 250 10*3/uL (ref 150–400)
RBC: 4.31 MIL/uL (ref 3.87–5.11)
RDW: 20.4 % — ABNORMAL HIGH (ref 11.5–15.5)
WBC: 10.4 10*3/uL (ref 4.0–10.5)

## 2012-02-10 MED ORDER — NIFEDIPINE 10 MG PO CAPS
10.0000 mg | ORAL_CAPSULE | Freq: Once | ORAL | Status: AC
  Administered 2012-02-10: 10 mg via ORAL
  Filled 2012-02-10: qty 1

## 2012-02-10 MED ORDER — NIFEDIPINE 10 MG PO CAPS
10.0000 mg | ORAL_CAPSULE | Freq: Four times a day (QID) | ORAL | Status: DC
  Administered 2012-02-11 – 2012-02-29 (×76): 10 mg via ORAL
  Filled 2012-02-10 (×78): qty 1

## 2012-02-10 NOTE — Progress Notes (Signed)
Ur chart review completed.  

## 2012-02-10 NOTE — Consult Note (Signed)
MFM Note  Peggy [name] is very well known to our service. She was admitted at 23 weeks for PPROM (at 19 weeks) and given BMZ and antibiotics. Currently she is 25+5 weeks. There are no s/s of infection. Last night she experienced a small amount of bright red bleeding associated with uterine cramps but now for several hours there has been no bleeding or contractions. She reports no leaking of clear fluid.  [name] indicated to me that she would like "everything done" for her baby including urgent C/S. She is aware of the very guarded prognosis.   Assessment: 1) IUP at 25+5 weeks; cephalic position 2) PPROM since 19 weeks; no s/s of IUI 3) Placental abruption; [facility] resolving  4) Severe oligohydramnios  Recommendations 1) Would begin antenatal surveillance such as daily fetal heart tracings - looking for signs of cord compression, etc. and kick counts 2) F/U US for growth in 2 weeks 3) Continue in house management due to severe oligohydramnios, vaginal bleeding and her desire for full resuscitation  Please call us again if we can help.

## 2012-02-10 NOTE — Progress Notes (Signed)
HD# 17 Pt had vaginal bleeding last night. None this am. Also had some cramping. Pt had an Ultrasound this am to check the AFI. Report is pending. Baby is moving well. VSSAF Abd- soft IMP/ IUP at 25 5/7 weeks with PPROM         No current leakage of fluid, with some increase in AFI PLAN/ Will await u/s report             Will check cervix             Will await MFM suggestions.

## 2012-02-11 ENCOUNTER — Encounter (HOSPITAL_COMMUNITY): Payer: Self-pay | Admitting: *Deleted

## 2012-02-11 LAB — TYPE AND SCREEN: ABO/RH(D): A POS

## 2012-02-11 MED ORDER — SODIUM CHLORIDE 0.9 % IJ SOLN
3.0000 mL | INTRAMUSCULAR | Status: DC | PRN
  Administered 2012-02-11 – 2012-02-12 (×2): 3 mL via INTRAVENOUS

## 2012-02-11 MED ORDER — LACTATED RINGERS IV BOLUS (SEPSIS)
500.0000 mL | INTRAVENOUS | Status: DC | PRN
  Administered 2012-02-11: 500 mL via INTRAVENOUS

## 2012-02-11 NOTE — Progress Notes (Signed)
Patient is stable.  FHT are reactive with occasional (hourly) decels. See note from Dr. Sherrie George.  Will continue hospital observation until delivery.

## 2012-02-12 MED ORDER — SODIUM CHLORIDE 0.9 % IJ SOLN
3.0000 mL | Freq: Two times a day (BID) | INTRAMUSCULAR | Status: DC
  Administered 2012-02-13 – 2012-02-15 (×5): 3 mL via INTRAVENOUS

## 2012-02-12 NOTE — Progress Notes (Signed)
Pt reports good FM and no vaginal bleeding. Very rare contraction on Procardia.

## 2012-02-12 NOTE — Progress Notes (Signed)
02/12/12 1200  Clinical Encounter Type  Visited With Patient  Visit Type Initial;Spiritual support;Social support  Spiritual Encounters  Spiritual Needs Emotional    Made lengthy initial visit with Peggy [name], who made good use of opportunity to share and process her feelings about pregnancy/complications, hopes for expanding her family, communication with husband and children, etc.  She reports having good support from husband, MIL (in town from [place] to provide extra support), and other family.  Peggy [name] appears to have good tools for reflecting, coping, making meaning, fostering perspective and hope, and maintaining a realistic outlook and positive attitude.  Provided spiritual companionship, pastoral reflection, affirmation, and encouragement.  Will follow for support.  [address] [place], [place] [phone]

## 2012-02-14 NOTE — Progress Notes (Signed)
HD#21 with PPROM 19wks and large placental abruption, last [place] 1/27, s/p beta x 2. Pt reports small amount of bleeding started Sunday, and has continued but is minimal.  Dr. Dareen Piano placed pt on Procardia ans she has remained on it since then.  Decels with ctx noted, pt now on continuous FHT monitoring.  She reports only mild tightening 2-3 x qd.  She denies any abd tenderness, fever, CP/SOB/contipation/diarrhea or urinary issues.   Continue current care, will discuss with Dr. Sherrie George today.

## 2012-02-15 LAB — TYPE AND SCREEN: ABO/RH(D): A POS

## 2012-02-15 NOTE — Progress Notes (Signed)
HD#22 PPROM @ 19 with readmit 23+wks Pt stable.  Continued decels intermittently, but with good variability and recovery otherwise. [place] now only small amount of dark brown. Good fetal movement, no other complaints. Discussed pt with Dr. Sherrie George - per Dr. Sherrie George continued Procardia for symptomatic ctx pain ok.  Can consider d/c if pt remains comfortable. Continue current mgmt.

## 2012-02-16 NOTE — Progress Notes (Signed)
HD#23 PPROM 19wk, readmit 23+ weeks for steroids ( no abx) Pt doing well, mild cramps occasionally.  Denies any further bleeding, good FM.  No other complaints. FHT overall reassurring, continued episodic decels, some prolonged to 4 min but with good recovery, good variability as well as accelerations. TOCO quiet Continue current care Pt requested no IV reinsertion yesterday.  Advised ok for now, but with add'l bleeding will need to place IV access.

## 2012-02-17 NOTE — Progress Notes (Signed)
02/17/12 1500  Clinical Encounter Type  Visited With Patient  Visit Type Follow-up    Attempted follow-up visit, but Peggy [name] was trying to nap.  We planned for me to follow up tomorrow instead.  [address] [place], [place] [phone]

## 2012-02-17 NOTE — Progress Notes (Signed)
Stable

## 2012-02-17 NOTE — Progress Notes (Signed)
Ur chart review completed.  

## 2012-02-18 LAB — TYPE AND SCREEN
ABO/RH(D): A POS
Antibody Screen: NEGATIVE

## 2012-02-18 MED ORDER — ZINC OXIDE 20 % EX OINT
TOPICAL_OINTMENT | CUTANEOUS | Status: DC | PRN
  Administered 2012-02-18: 19:00:00 via TOPICAL
  Filled 2012-02-18: qty 28.35

## 2012-02-18 NOTE — Progress Notes (Signed)
HD#23 PPROM 19wk, readmit 23+ weeks for steroids ( no abx)  Pt doing well, mild cramps occasionally. Denies any further bleeding, good FM. No other complaints.  Filed Vitals:   02/17/12 2200 02/17/12 2300 02/17/12 2356 02/18/12 0157  BP:   109/68 101/60  Pulse:   92 86  Temp:   98.4 F (36.9 C) 97.6 F (36.4 C)  TempSrc:   Oral Oral  Resp: 18 18 20 18   Height:      Weight:      SpO2:       FHT overall reassurring, good STV, NST R; continued episodic decels, some prolonged to 4 min but with good recovery . Class 2 TOCO quiet  Continue current care- no s/s of infection. Pt requested no IV reinsertion. Advised ok for now, but with add'l bleeding will need to place IV access.

## 2012-02-19 NOTE — Progress Notes (Signed)
Stable

## 2012-02-19 NOTE — Progress Notes (Signed)
02/19/12 1500  Clinical Encounter Type  Visited With Patient and family together (MIL Alaney Witter, in for 5 weeks from [facility])  Visit Type Follow-up;Spiritual support;Social support  Spiritual Encounters  Spiritual Needs Emotional    Peggy [name] was in good spirits, joking and laughing with her mother-in-law, during this follow-up visit.  She reports that she and baby are having a really good day.  I provided pastoral listening and encouragement.  Will continue to follow for support.  [address] [place], [place] [phone]

## 2012-02-20 NOTE — Progress Notes (Addendum)
Patient ID: Peggy [name], female   DOB: [birth year], 37 y.o.   MRN: [id number]   S: Woke up this morning and had more leakage of fluid that is blood tinged.  Denies cramping/contractions/pain.  Active fetal movement.  No fever O: Filed Vitals:   02/19/12 2006 02/20/12 0209 02/20/12 0755 02/20/12 0757  BP: 106/68 107/77  111/73  Pulse: 96 93  87  Temp: 98.9 F (37.2 C) 98.4 F (36.9 C) 98.6 F (37 C)   TempSrc: Oral Oral Oral   Resp: 18 18 20    Height:      Weight:      SpO2:       AOX3, NAD Abd soft, NT Pad blood tinged fluid, not frank blood FHT 140-150 reactive with accels, occassional mild variables toco quiet  A/P 1) Continue continuous monitoring 2) Pad count 3) If LOF continues will need to replace IV 4) SCDS for DVT prophylaxis 5) U/S for growth ordered for 2/10

## 2012-02-21 LAB — TYPE AND SCREEN
ABO/RH(D): A POS
Antibody Screen: NEGATIVE

## 2012-02-21 NOTE — Progress Notes (Signed)
Peggy [name] was in good spirits today and is trying to stay positive about her experience.  She admitted that this has been difficult on her husband as he takes care of the family.  She is doing a good job of not getting stressed about her home and relying on the good support of her husband and family.  She's grateful that she has been able to skype and talk with her children.  We spoke about the joys and difficulties of parenting and of waiting.  I offered spiritual companionship and affirmation.  [employer] Pager, [phone] 3:55 PM   02/21/12 1500  Clinical Encounter Type  Visited With Patient  Visit Type Follow-up;Spiritual support  Spiritual Encounters  Spiritual Needs Emotional

## 2012-02-21 NOTE — Progress Notes (Signed)
Pt had some bleeding last night and a heplock was placed. No bleeding at this time. No contractions. Good FM. PLAN/ Pt to have follow ultrasound on Monday.            Will follow bleeding and FHTs.

## 2012-02-22 ENCOUNTER — Inpatient Hospital Stay (HOSPITAL_COMMUNITY): Payer: Managed Care, Other (non HMO)

## 2012-02-22 NOTE — Progress Notes (Signed)
Pt without complaints. Had no bleeding last pm. Good Fm Still with occ decel with good recovery. Reactive at this moment. IMP/ no change in status PLAN/ check u/s on Monday.

## 2012-02-23 NOTE — Progress Notes (Signed)
Pt has no complaints. No bleeding, no contractions. VSSAF IMP/ stable PLAN/ U/S tomorrow.

## 2012-02-24 ENCOUNTER — Inpatient Hospital Stay (HOSPITAL_COMMUNITY): Payer: Managed Care, Other (non HMO)

## 2012-02-24 MED ORDER — BETAMETHASONE SOD PHOS & ACET 6 (3-3) MG/ML IJ SUSP
12.0000 mg | Freq: Every morning | INTRAMUSCULAR | Status: AC
  Administered 2012-02-25 – 2012-02-26 (×2): 12 mg via INTRAMUSCULAR
  Filled 2012-02-24 (×2): qty 2

## 2012-02-24 MED ORDER — SODIUM CHLORIDE 0.9 % IJ SOLN
3.0000 mL | Freq: Two times a day (BID) | INTRAMUSCULAR | Status: DC
  Administered 2012-02-24 – 2012-02-29 (×9): 3 mL via INTRAVENOUS

## 2012-02-24 NOTE — Progress Notes (Signed)
Pt in good spirits, no complaints.  No bleeding, no ctx.  Some continued LOF intermittently.  Good FM. US performed today. Per Dr. Harlon Flor: Continune inpatient management, at least daily testing  ( we will continuous given decels) Recommend repeating betamethasone course (last dose given approximately 4 weeks ago)  Follow up growth scan in 2 weeks. Cont other mgmt.

## 2012-02-24 NOTE — Progress Notes (Signed)
Peggy [name]  was seen today for an ultrasound appointment.  See full report in [facility].  Impression: Single IUP at 27 5/7 weeks PPROM since [redacted] weeks gestation Interval growth is appropriate (34th %tile) Anhydramnios appreciated.  A stomach bubble was noted.  Recommendations: Continune inpatient management, at least daily testing Recommend repeating betamethasone course (last dose given approximately 4 weeks ago) Follow up growth scan in 2 weeks.  Alpha Gula, MD

## 2012-02-24 NOTE — Progress Notes (Signed)
Ur chart review completed.  

## 2012-02-25 NOTE — Progress Notes (Signed)
37 y.o. G2P0101 [redacted]w[redacted]d HD#32 admitted for 24WKS, ROM.  Pt currently stable with no c/o ctxes or pain.  Good FM.  Filed Vitals:   02/24/12 1311 02/24/12 1522 02/24/12 2002 02/25/12 0004  BP:   115/71 111/61  Pulse:  88 94 90  Temp: 98.8 F (37.1 C)  98.3 F (36.8 C) 98.3 F (36.8 C)  TempSrc:   Oral Oral  Resp: 16  18 18   Height:      Weight:      SpO2:  98%       Lungs CTA Cor RRR Abd  Soft, gravid, nontender Ex SCDs FHTs  130s, good short term variability, NST R; still having mild variables intermittantly, this am only one severe variable- class 2 Toco  none  No results found for this or any previous visit (from the past 24 hour(s)).  A/P:  HD#32  [redacted]w[redacted]d with oligo secondary PPROM.  U/S 34%ile yesterday with oligo.  Betamethasone repeated.  Continued EFM secondary severe variables.   Hilmar Moldovan A

## 2012-02-26 NOTE — Progress Notes (Signed)
HD#33  PPROM/ Chronic abruption Pt stable, no complaints.   S/p repeat course of beta x 2. Cont other current mgmt.

## 2012-02-27 LAB — TYPE AND SCREEN

## 2012-02-27 MED ORDER — LACTATED RINGERS IV BOLUS (SEPSIS)
500.0000 mL | INTRAVENOUS | Status: DC | PRN
  Administered 2012-02-27 – 2012-02-29 (×2): 1000 mL via INTRAVENOUS
  Administered 2012-02-29: 500 mL via INTRAVENOUS

## 2012-02-27 NOTE — Progress Notes (Signed)
HD#34 Pt had increased uterine activity last PM. She was given IVFs with resolution of symptoms. No leakage of fluid. No bleeding. Good FM. IMP/ stable PLAN/ continue with current plan.

## 2012-02-27 NOTE — Progress Notes (Signed)
Busy signal
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
## 2012-02-27 NOTE — Progress Notes (Signed)
TC to DR Claiborne Billings busy signal.

## 2012-02-28 NOTE — Progress Notes (Signed)
Peggy [name] was in good spirits and was grateful for the extra care and attention on Valentine's Day.  She continues to stay positive and she is be able to connect with her family with face time.  I offered spiritual companionship and reflective listening.  We will continue to check in on her, but please page as needs arise.  [address] [place] Pager, [phone] 3:22 PM   02/28/12 1500  Clinical Encounter Type  Visited With Patient  Visit Type Follow-up

## 2012-02-28 NOTE — Progress Notes (Signed)
37 y.o. G2P0101 [redacted]w[redacted]d HD#35 admitted for 24WKS, ROM.  Pt currently stable with no c/o.  Good FM.  Filed Vitals:   02/27/12 1226 02/27/12 1456 02/27/12 1935 02/28/12 0207  BP: 105/65 99/56 107/67 93/60  Pulse: 97 110 89 87  Temp: 98.2 F (36.8 C)  98.7 F (37.1 C) 98.2 F (36.8 C)  TempSrc: Oral  Oral Oral  Resp: 20 20 16 16   Height:      Weight:      SpO2:        Lungs CTA Cor RRR Abd  Soft, gravid, nontender Ex SCDs FHTs  130s, good short term variability, NST R; continued mild variables; this am no mod or severe;  Class 2 Toco  occ  No results found for this or any previous visit (from the past 24 hour(s)).  A:  HD#35  [redacted]w[redacted]d with PPROM.  P: Continue current plan with observation.  Mardelle Pandolfi A

## 2012-02-29 ENCOUNTER — Encounter (HOSPITAL_COMMUNITY)
Admission: AD | Disposition: A | Discharge status: Home / Self Care | Payer: Self-pay | Source: Ambulatory Visit | Attending: Obstetrics and Gynecology

## 2012-02-29 ENCOUNTER — Encounter (HOSPITAL_COMMUNITY): Payer: Self-pay | Admitting: Anesthesiology

## 2012-02-29 ENCOUNTER — Inpatient Hospital Stay (HOSPITAL_COMMUNITY): Payer: Managed Care, Other (non HMO) | Admitting: Anesthesiology

## 2012-02-29 ENCOUNTER — Inpatient Hospital Stay (HOSPITAL_COMMUNITY): Payer: Managed Care, Other (non HMO)

## 2012-02-29 DIAGNOSIS — O42919 Preterm premature rupture of membranes, unspecified as to length of time between rupture and onset of labor, unspecified trimester: Secondary | ICD-10-CM

## 2012-02-29 LAB — PROTIME-INR
INR: 0.98 (ref 0.00–1.49)
Prothrombin Time: 12.9 seconds (ref 11.6–15.2)

## 2012-02-29 LAB — CBC
HCT: 33.8 % — ABNORMAL LOW (ref 36.0–46.0)
Hemoglobin: 11.1 g/dL — ABNORMAL LOW (ref 12.0–15.0)
RBC: 4.08 MIL/uL (ref 3.87–5.11)
WBC: 11.8 10*3/uL — ABNORMAL HIGH (ref 4.0–10.5)

## 2012-02-29 LAB — FIBRINOGEN: Fibrinogen: 565 mg/dL — ABNORMAL HIGH (ref 204–475)

## 2012-02-29 LAB — PREPARE RBC (CROSSMATCH)

## 2012-02-29 SURGERY — Surgical Case
Anesthesia: General | Site: Abdomen | Wound class: Clean Contaminated

## 2012-02-29 MED ORDER — CITRIC ACID-SODIUM CITRATE 334-500 MG/5ML PO SOLN
ORAL | Status: AC
  Administered 2012-02-29: 30 mL
  Filled 2012-02-29: qty 15

## 2012-02-29 MED ORDER — PHENYLEPHRINE 40 MCG/ML (10ML) SYRINGE FOR IV PUSH (FOR BLOOD PRESSURE SUPPORT)
PREFILLED_SYRINGE | INTRAVENOUS | Status: AC
  Filled 2012-02-29: qty 5

## 2012-02-29 MED ORDER — CEFAZOLIN SODIUM-DEXTROSE 2-3 GM-% IV SOLR
INTRAVENOUS | Status: DC | PRN
  Administered 2012-02-29: 2 g via INTRAVENOUS

## 2012-02-29 MED ORDER — CEFAZOLIN SODIUM-DEXTROSE 2-3 GM-% IV SOLR
INTRAVENOUS | Status: AC
  Filled 2012-02-29: qty 50

## 2012-02-29 MED ORDER — OXYTOCIN 10 UNIT/ML IJ SOLN
40.0000 [IU] | INTRAVENOUS | Status: DC | PRN
  Administered 2012-02-29: 40 [IU] via INTRAVENOUS

## 2012-02-29 MED ORDER — FENTANYL CITRATE 0.05 MG/ML IJ SOLN
INTRAMUSCULAR | Status: DC | PRN
  Administered 2012-02-29: 25 ug via INTRATHECAL

## 2012-02-29 MED ORDER — MORPHINE SULFATE (PF) 0.5 MG/ML IJ SOLN
INTRAMUSCULAR | Status: DC | PRN
  Administered 2012-02-29: .15 mg via INTRATHECAL

## 2012-02-29 MED ORDER — LACTATED RINGERS IV SOLN
INTRAVENOUS | Status: DC | PRN
  Administered 2012-02-29 (×3): via INTRAVENOUS

## 2012-02-29 MED ORDER — ONDANSETRON HCL 4 MG/2ML IJ SOLN
INTRAMUSCULAR | Status: DC | PRN
  Administered 2012-02-29: 4 mg via INTRAVENOUS

## 2012-02-29 MED ORDER — MORPHINE SULFATE 0.5 MG/ML IJ SOLN
INTRAMUSCULAR | Status: AC
  Filled 2012-02-29: qty 10

## 2012-02-29 MED ORDER — OXYTOCIN 10 UNIT/ML IJ SOLN
INTRAMUSCULAR | Status: AC
  Filled 2012-02-29: qty 4

## 2012-02-29 MED ORDER — 0.9 % SODIUM CHLORIDE (POUR BTL) OPTIME
TOPICAL | Status: DC | PRN
  Administered 2012-02-29: 400 mL
  Administered 2012-03-01: 200 mL

## 2012-02-29 MED ORDER — BUPIVACAINE IN DEXTROSE 0.75-8.25 % IT SOLN
INTRATHECAL | Status: DC | PRN
  Administered 2012-02-29: 13 mg via INTRATHECAL

## 2012-02-29 MED ORDER — ONDANSETRON HCL 4 MG/2ML IJ SOLN
INTRAMUSCULAR | Status: AC
  Filled 2012-02-29: qty 2

## 2012-02-29 MED ORDER — PHENYLEPHRINE HCL 10 MG/ML IJ SOLN
INTRAMUSCULAR | Status: DC | PRN
  Administered 2012-02-29 – 2012-03-01 (×8): 80 ug via INTRAVENOUS
  Administered 2012-03-01: 40 ug via INTRAVENOUS

## 2012-02-29 MED ORDER — FENTANYL CITRATE 0.05 MG/ML IJ SOLN
INTRAMUSCULAR | Status: AC
  Filled 2012-02-29: qty 2

## 2012-02-29 SURGICAL SUPPLY — 35 items
ADH SKN CLS LQ APL DERMABOND (GAUZE/BANDAGES/DRESSINGS) ×1
CLOTH BEACON ORANGE TIMEOUT ST (SAFETY) ×2 IMPLANT
DERMABOND ADHESIVE PROPEN (GAUZE/BANDAGES/DRESSINGS) ×1
DERMABOND ADVANCED .7 DNX6 (GAUZE/BANDAGES/DRESSINGS) IMPLANT
DRAPE LG THREE QUARTER DISP (DRAPES) ×2 IMPLANT
DRSG OPSITE POSTOP 4X10 (GAUZE/BANDAGES/DRESSINGS) ×2 IMPLANT
DRSG OPSITE POSTOP 4X12 (GAUZE/BANDAGES/DRESSINGS) ×1 IMPLANT
DURAPREP 26ML APPLICATOR (WOUND CARE) ×2 IMPLANT
ELECT REM PT RETURN 9FT ADLT (ELECTROSURGICAL) ×2
ELECTRODE REM PT RTRN 9FT ADLT (ELECTROSURGICAL) ×1 IMPLANT
EXTRACTOR VACUUM BELL STYLE (SUCTIONS) IMPLANT
GLOVE BIO SURGEON STRL SZ7 (GLOVE) ×4 IMPLANT
GOWN PREVENTION PLUS LG XLONG (DISPOSABLE) ×4 IMPLANT
KIT ABG SYR 3ML LUER SLIP (SYRINGE) ×1 IMPLANT
NDL HYPO 25X5/8 SAFETYGLIDE (NEEDLE) IMPLANT
NEEDLE HYPO 25X5/8 SAFETYGLIDE (NEEDLE) ×2 IMPLANT
NS IRRIG 1000ML POUR BTL (IV SOLUTION) ×2 IMPLANT
PACK C SECTION WH (CUSTOM PROCEDURE TRAY) ×2 IMPLANT
PAD OB MATERNITY 4.3X12.25 (PERSONAL CARE ITEMS) ×2 IMPLANT
RETRACTOR WND ALEXIS 25 LRG (MISCELLANEOUS) ×1 IMPLANT
RTRCTR WOUND ALEXIS 25CM LRG (MISCELLANEOUS) ×2
SLEEVE SCD COMPRESS KNEE MED (MISCELLANEOUS) IMPLANT
STAPLER VISISTAT 35W (STAPLE) IMPLANT
SUT MNCRL 0 VIOLET CTX 36 (SUTURE) ×2 IMPLANT
SUT MONOCRYL 0 CTX 36 (SUTURE) ×2
SUT PDS AB 0 CTX 60 (SUTURE) IMPLANT
SUT PLAIN 2 0 XLH (SUTURE) ×1 IMPLANT
SUT VIC AB 0 CT1 27 (SUTURE) ×4
SUT VIC AB 0 CT1 27XBRD ANBCTR (SUTURE) ×2 IMPLANT
SUT VIC AB 2-0 CT1 27 (SUTURE) ×2
SUT VIC AB 2-0 CT1 TAPERPNT 27 (SUTURE) ×1 IMPLANT
SUT VIC AB 4-0 KS 27 (SUTURE) ×1 IMPLANT
TOWEL OR 17X24 6PK STRL BLUE (TOWEL DISPOSABLE) ×6 IMPLANT
TRAY FOLEY CATH 14FR (SET/KITS/TRAYS/PACK) ×2 IMPLANT
WATER STERILE IRR 1000ML POUR (IV SOLUTION) ×2 IMPLANT

## 2012-02-29 NOTE — Preoperative (Signed)
Beta Blockers   Reason not to administer Beta Blockers:Not Applicable 

## 2012-02-29 NOTE — Progress Notes (Addendum)
BPP 8/10 (2 off for fluid.) U/S shows no new or expanding clot behind placenta. Cervix is 3 long.  No blood on peripad in last 45 minutes.  Continue obs. All R/B/Alt d/w pt with regards to C/S.

## 2012-02-29 NOTE — Progress Notes (Signed)
CTSP  All day FHTs had been class 1, NST R and without decels. At about 1830, the pt had a decel to the 80s for two minutes followed by another decel to the 60s for 3-5 minutes. After recovery, the FHTs were tachycardic but did not exhibit repetitive late decels.  The pt was escorted to the bathroom at about 1900 and had a substantial amount of bleeding on her pad (covered the top of a peripad and some soaking through.)  Back in bed, she only had a quarter size amount of bleeding on her pad.  When she used the bedpan next, again her pad had bleeding covering the top.  FHTs now 150s gstv, NST R, occasional mild variables. Toco q 10-20 SSE visually closed cervix with only slow trickle of dark red blood from os. SVE deferred.  Last U/S on 2-10 showed anhydramnios, vtx infant, adequate growth.  A/P  PPROM since 19 weeks, hx of abruption at 19 weeks. Possible exacerbation of abruption tonight but currently bleeding is not active and FHTs are reassuring. NPO for now. Per Dr. Malen Gauze, DIC panel drawn.  T&S now. Bedside U/S for fetal well being and look at placenta.

## 2012-02-29 NOTE — Anesthesia Preprocedure Evaluation (Addendum)
Anesthesia Evaluation  Patient identified by MRN, date of birth, ID band Patient awake    Reviewed: Allergy & Precautions, H&P , NPO status , Patient's Chart, lab work & pertinent test results  Airway Mallampati: III TM Distance: >3 FB Neck ROM: Full    Dental no notable dental hx. (+) Teeth Intact   Pulmonary neg pulmonary ROS,  breath sounds clear to auscultation  Pulmonary exam normal       Cardiovascular negative cardio ROS  Rhythm:Regular Rate:Normal     Neuro/Psych Questionable diagnosis of MS, was undergoing w/u when she discovered she was pregnant. Had blindness in left eye.   Neuromuscular disease negative psych ROS   GI/Hepatic Neg liver ROS, GERD-  Medicated,  Endo/Other  negative endocrine ROS  Renal/GU negative Renal ROS  negative genitourinary   Musculoskeletal  (+) Fibromyalgia -  Abdominal   Peds  Hematology negative hematology ROS (+)   Anesthesia Other Findings   Reproductive/Obstetrics Previous C/Section - Breech Placental Abruption- increased bleeding this pm 28 weeks 3days Fetal decelerations earlier this pm                           Anesthesia Physical Anesthesia Plan  ASA: II and emergent  Anesthesia Plan: General and Spinal   Post-op Pain Management:    Induction:   Airway Management Planned:   Additional Equipment:   Intra-op Plan:   Post-operative Plan:   Informed Consent: I have reviewed the patients History and Physical, chart, labs and discussed the procedure including the risks, benefits and alternatives for the proposed anesthesia with the patient or authorized representative who has indicated his/her understanding and acceptance.   Dental advisory given  Plan Discussed with: CRNA, Anesthesiologist and Surgeon  Anesthesia Plan Comments: (Discussed both GA vs RA (SAB) with patient and patient's husband. Risks benefits and alternatives of each  depending on urgency of situation. Questions answered. Awaiting bedside ultrasound. New labs requested including DIC panel. Discussed with Dr. Henderson Cloud.)       Anesthesia Quick Evaluation

## 2012-02-29 NOTE — Anesthesia Procedure Notes (Signed)
Spinal  Patient location during procedure: OR Start time: 02/29/2012 11:29 PM Staffing Anesthesiologist: Jesusa Stenerson A. Performed by: anesthesiologist  Preanesthetic Checklist Completed: patient identified, site marked, surgical consent, pre-op evaluation, timeout performed, IV checked, risks and benefits discussed and monitors and equipment checked Spinal Block Patient position: sitting Prep: site prepped and draped and DuraPrep Patient monitoring: heart rate, cardiac monitor, continuous pulse ox and blood pressure Approach: midline Location: L3-4 Injection technique: single-shot Needle Needle type: Sprotte  Needle gauge: 24 G Needle length: 9 cm Needle insertion depth: 5 cm Assessment Sensory level: T4 Additional Notes Patient tolerated procedure well. Adequate sensory level.

## 2012-02-29 NOTE — Progress Notes (Signed)
Called about pt bleeding again.  Dark blood, this time saturated pad. SVE cl/th/high, active bleeding like moderate period. FHTs now 150-160s, mild variables with one decel to 120s.  D/w pt need for c/s now for abruption. Pt agrees and all R/B/Alt had been discussed.

## 2012-02-29 NOTE — Progress Notes (Signed)
37 y.o. G2P0101 [redacted]w[redacted]d HD#36 admitted for 24WKS, ROM.  Pt currently stable with no c/o.  Good FM.  Filed Vitals:   02/28/12 2300 02/29/12 0152 02/29/12 0759 02/29/12 0856  BP:  105/60 103/67   Pulse:  85 90   Temp:  98.7 F (37.1 C) 98.2 F (36.8 C)   TempSrc:  Oral Oral   Resp: 18 16 20 20   Height:      Weight:      SpO2:        Lungs CTA Cor RRR Abd  Soft, gravid, nontender Ex SCDs FHTs  120s, good short term variability, NST R; class 1 today Toco  occ  No results found for this or any previous visit (from the past 24 hour(s)).  A:  HD#36  [redacted]w[redacted]d with PPROM.  P: Continue current.  Neil Errickson A

## 2012-03-01 LAB — CBC
HCT: 32.6 % — ABNORMAL LOW (ref 36.0–46.0)
Hemoglobin: 10.6 g/dL — ABNORMAL LOW (ref 12.0–15.0)
MCHC: 32.5 g/dL (ref 30.0–36.0)
RBC: 3.96 MIL/uL (ref 3.87–5.11)
WBC: 17 10*3/uL — ABNORMAL HIGH (ref 4.0–10.5)

## 2012-03-01 MED ORDER — SODIUM CHLORIDE 0.9 % IJ SOLN: 3.0000 mL | INTRAMUSCULAR | Status: DC | PRN

## 2012-03-01 MED ORDER — DIBUCAINE 1 % RE OINT: 1.0000 "application " | TOPICAL_OINTMENT | RECTAL | Status: DC | PRN

## 2012-03-01 MED ORDER — SCOPOLAMINE 1 MG/3DAYS TD PT72
MEDICATED_PATCH | TRANSDERMAL | Status: AC
  Filled 2012-03-01: qty 1

## 2012-03-01 MED ORDER — SENNOSIDES-DOCUSATE SODIUM 8.6-50 MG PO TABS
2.0000 | ORAL_TABLET | Freq: Every day | ORAL | Status: DC
  Administered 2012-03-01 – 2012-03-02 (×2): 2 via ORAL

## 2012-03-01 MED ORDER — FLEET ENEMA 7-19 GM/118ML RE ENEM: 1.0000 | ENEMA | Freq: Every day | RECTAL | Status: DC | PRN

## 2012-03-01 MED ORDER — MEPERIDINE HCL 25 MG/ML IJ SOLN: 6.2500 mg | INTRAMUSCULAR | Status: DC | PRN

## 2012-03-01 MED ORDER — METHYLERGONOVINE MALEATE 0.2 MG PO TABS: 0.2000 mg | ORAL_TABLET | ORAL | Status: DC | PRN

## 2012-03-01 MED ORDER — METHYLERGONOVINE MALEATE 0.2 MG/ML IJ SOLN: 0.2000 mg | INTRAMUSCULAR | Status: DC | PRN

## 2012-03-01 MED ORDER — WITCH HAZEL-GLYCERIN EX PADS: 1.0000 "application " | MEDICATED_PAD | CUTANEOUS | Status: DC | PRN

## 2012-03-01 MED ORDER — NALOXONE HCL 1 MG/ML IJ SOLN
1.0000 ug/kg/h | INTRAVENOUS | Status: DC | PRN
  Filled 2012-03-01: qty 2

## 2012-03-01 MED ORDER — ZOLPIDEM TARTRATE 5 MG PO TABS: 5.0000 mg | ORAL_TABLET | Freq: Every evening | ORAL | Status: DC | PRN

## 2012-03-01 MED ORDER — TETANUS-DIPHTH-ACELL PERTUSSIS 5-2.5-18.5 LF-MCG/0.5 IM SUSP
0.5000 mL | Freq: Once | INTRAMUSCULAR | Status: AC
  Administered 2012-03-01: 0.5 mL via INTRAMUSCULAR
  Filled 2012-03-01: qty 0.5

## 2012-03-01 MED ORDER — FENTANYL CITRATE 0.05 MG/ML IJ SOLN: 25.0000 ug | INTRAMUSCULAR | Status: DC | PRN

## 2012-03-01 MED ORDER — KETOROLAC TROMETHAMINE 30 MG/ML IJ SOLN
INTRAMUSCULAR | Status: AC
  Administered 2012-03-01: 30 mg
  Filled 2012-03-01: qty 1

## 2012-03-01 MED ORDER — LANOLIN HYDROUS EX OINT: 1.0000 "application " | TOPICAL_OINTMENT | CUTANEOUS | Status: DC | PRN

## 2012-03-01 MED ORDER — NALOXONE HCL 0.4 MG/ML IJ SOLN: 0.4000 mg | INTRAMUSCULAR | Status: DC | PRN

## 2012-03-01 MED ORDER — METOCLOPRAMIDE HCL 5 MG/ML IJ SOLN: 10.0000 mg | Freq: Three times a day (TID) | INTRAMUSCULAR | Status: DC | PRN

## 2012-03-01 MED ORDER — OXYCODONE-ACETAMINOPHEN 5-325 MG PO TABS
1.0000 | ORAL_TABLET | ORAL | Status: DC | PRN
  Administered 2012-03-01 – 2012-03-03 (×9): 1 via ORAL
  Filled 2012-03-01 (×8): qty 1

## 2012-03-01 MED ORDER — PHENYLEPHRINE 40 MCG/ML (10ML) SYRINGE FOR IV PUSH (FOR BLOOD PRESSURE SUPPORT)
PREFILLED_SYRINGE | INTRAVENOUS | Status: AC
  Filled 2012-03-01: qty 5

## 2012-03-01 MED ORDER — FERROUS SULFATE 325 (65 FE) MG PO TABS
325.0000 mg | ORAL_TABLET | Freq: Two times a day (BID) | ORAL | Status: DC
  Administered 2012-03-01 – 2012-03-03 (×5): 325 mg via ORAL
  Filled 2012-03-01 (×5): qty 1

## 2012-03-01 MED ORDER — DIPHENHYDRAMINE HCL 50 MG/ML IJ SOLN: 25.0000 mg | INTRAMUSCULAR | Status: DC | PRN

## 2012-03-01 MED ORDER — DIPHENHYDRAMINE HCL 25 MG PO CAPS: 25.0000 mg | ORAL_CAPSULE | ORAL | Status: DC | PRN

## 2012-03-01 MED ORDER — DIPHENHYDRAMINE HCL 50 MG/ML IJ SOLN: 12.5000 mg | INTRAMUSCULAR | Status: DC | PRN

## 2012-03-01 MED ORDER — ONDANSETRON HCL 4 MG/2ML IJ SOLN: 4.0000 mg | INTRAMUSCULAR | Status: DC | PRN

## 2012-03-01 MED ORDER — NALBUPHINE HCL 10 MG/ML IJ SOLN
5.0000 mg | INTRAMUSCULAR | Status: DC | PRN
  Filled 2012-03-01: qty 1

## 2012-03-01 MED ORDER — INFLUENZA VIRUS VACC SPLIT PF IM SUSP
0.5000 mL | Freq: Once | INTRAMUSCULAR | Status: AC
  Administered 2012-03-01: 0.5 mL via INTRAMUSCULAR
  Filled 2012-03-01: qty 0.5

## 2012-03-01 MED ORDER — IBUPROFEN 600 MG PO TABS
600.0000 mg | ORAL_TABLET | Freq: Four times a day (QID) | ORAL | Status: DC
  Administered 2012-03-01 – 2012-03-03 (×10): 600 mg via ORAL
  Filled 2012-03-01 (×4): qty 1

## 2012-03-01 MED ORDER — PRENATAL MULTIVITAMIN CH
1.0000 | ORAL_TABLET | Freq: Every day | ORAL | Status: DC
  Administered 2012-03-01 – 2012-03-03 (×3): 1 via ORAL
  Filled 2012-03-01 (×3): qty 1

## 2012-03-01 MED ORDER — IBUPROFEN 600 MG PO TABS
600.0000 mg | ORAL_TABLET | Freq: Four times a day (QID) | ORAL | Status: DC | PRN
  Filled 2012-03-01 (×5): qty 1

## 2012-03-01 MED ORDER — KETOROLAC TROMETHAMINE 30 MG/ML IJ SOLN: 30.0000 mg | Freq: Four times a day (QID) | INTRAMUSCULAR | Status: AC | PRN

## 2012-03-01 MED ORDER — ONDANSETRON HCL 4 MG PO TABS: 4.0000 mg | ORAL_TABLET | ORAL | Status: DC | PRN

## 2012-03-01 MED ORDER — MEASLES, MUMPS & RUBELLA VAC ~~LOC~~ INJ
0.5000 mL | INJECTION | Freq: Once | SUBCUTANEOUS | Status: DC
  Filled 2012-03-01: qty 0.5

## 2012-03-01 MED ORDER — LACTATED RINGERS IV SOLN
INTRAVENOUS | Status: DC
  Administered 2012-03-01: 12:00:00 via INTRAVENOUS

## 2012-03-01 MED ORDER — OXYTOCIN 40 UNITS IN LACTATED RINGERS INFUSION - SIMPLE MED: 62.5000 mL/h | INTRAVENOUS | Status: AC

## 2012-03-01 MED ORDER — SIMETHICONE 80 MG PO CHEW: 80.0000 mg | CHEWABLE_TABLET | ORAL | Status: DC | PRN

## 2012-03-01 MED ORDER — SIMETHICONE 80 MG PO CHEW
80.0000 mg | CHEWABLE_TABLET | Freq: Three times a day (TID) | ORAL | Status: DC
  Administered 2012-03-01 – 2012-03-03 (×10): 80 mg via ORAL

## 2012-03-01 MED ORDER — BISACODYL 10 MG RE SUPP: 10.0000 mg | Freq: Every day | RECTAL | Status: DC | PRN

## 2012-03-01 MED ORDER — DIPHENHYDRAMINE HCL 25 MG PO CAPS: 25.0000 mg | ORAL_CAPSULE | Freq: Four times a day (QID) | ORAL | Status: DC | PRN

## 2012-03-01 MED ORDER — MENTHOL 3 MG MT LOZG: 1.0000 | LOZENGE | OROMUCOSAL | Status: DC | PRN

## 2012-03-01 MED ORDER — ONDANSETRON HCL 4 MG/2ML IJ SOLN: 4.0000 mg | Freq: Three times a day (TID) | INTRAMUSCULAR | Status: DC | PRN

## 2012-03-01 MED ORDER — SCOPOLAMINE 1 MG/3DAYS TD PT72
1.0000 | MEDICATED_PATCH | Freq: Once | TRANSDERMAL | Status: DC
  Administered 2012-03-01: 1.5 mg via TRANSDERMAL

## 2012-03-01 NOTE — Anesthesia Postprocedure Evaluation (Signed)
  Anesthesia Post-op Note  Patient: Peggy [name]  Procedure(s) Performed: Procedure(s): Repeat CESAREAN SECTION of baby boy at 2341 APGAR 8/8 (N/A)  Patient Location: PACU  Anesthesia Type:Spinal  Level of Consciousness: awake, alert  and oriented  Airway and Oxygen Therapy: Patient Spontanous Breathing  Post-op Pain: none  Post-op Assessment: Post-op Vital signs reviewed, Patient's Cardiovascular Status Stable, Respiratory Function Stable, Patent Airway, No signs of Nausea or vomiting, Pain level controlled, No headache and No backache  Post-op Vital Signs: Reviewed and stable  Complications: No apparent anesthesia complications

## 2012-03-01 NOTE — Anesthesia Postprocedure Evaluation (Signed)
  Anesthesia Post-op Note  Patient: Peggy [name]  Procedure(s) Performed: Procedure(s): Repeat CESAREAN SECTION of baby boy at 2341 APGAR 8/8 (N/A)  Patient Location: PACU and Mother/Baby  Anesthesia Type:Spinal  Level of Consciousness: awake, alert  and oriented  Airway and Oxygen Therapy: Patient Spontanous Breathing  Post-op Pain: mild  Post-op Assessment: Patient's Cardiovascular Status Stable, Respiratory Function Stable, No signs of Nausea or vomiting, Adequate PO intake, Pain level controlled, No headache, No backache, No residual numbness and No residual motor weakness  Post-op Vital Signs: stable  Complications: No apparent anesthesia complications

## 2012-03-01 NOTE — OR Nursing (Signed)
50 ml blood loss during fundal massage by DLWegner RN, cord blood x 1 to OR front desk

## 2012-03-01 NOTE — Clinical Social Work Note (Signed)
Clinical Social Work Department PSYCHOSOCIAL ASSESSMENT - MATERNAL/CHILD 03/01/2012  Patient:  [name],[name]  Account Number:  [id number]  Admit Date:  01/24/2012  Childs Name:   Peggy [name]    Clinical Social Worker:  Suzana Sohail, LCSW   Date/Time:  03/01/2012 02:00 PM  Date Referred:  03/01/2012   Referral source  Physician  RN     Referred reason  NICU   Other referral source:    I:  FAMILY / HOME ENVIRONMENT Child's legal guardian:  PARENT  Guardian - Name Guardian - Age Guardian - Address  Peggy [name] 37 [address] [place], [place] [address]  Peggy [name]  [address] , [place] [address]   Other household support members/support persons Name Relationship DOB  39 year old    39 year old stays on weekends     Other support:   [name] and [name] report good family support, however most in other states.    II  PSYCHOSOCIAL DATA Information Source:  Patient Interview  Financial and Community Resources Employment:   [name]: unemployed since august  [name] employed   Financial resources:  Private Insurance If Medicaid - County:    School / Grade:   Maternity Care Coordinator / Child Services Coordination / Early Interventions:  Cultural issues impacting care:    III  STRENGTHS Strengths  Adequate Resources  Supportive family/friends  Home prepared for Child (including basic supplies)  Understanding of illness  Compliance with medical plan   Strength comment:    IV  RISK FACTORS AND CURRENT PROBLEMS Current Problem:  None   Risk Factor & Current Problem Patient Issue Family Issue Risk Factor / Current Problem Comment   N N     V  SOCIAL WORK ASSESSMENT CSW spoke with [name] and [name] at bedside.  CSW discussed admission to NICU and communication/understanding of treatment.  [name] reports no concerns and having good communication with doctor's and nurses.  CSW discussed any emotional concerns and [name] expressed some appropriate emotions with  admission to NICU, however no concerning symptoms at this time.  CSW discussed PPD symptoms and instructed [name] to let RN or CSW know if any concerns arise. CSW discussed supplies and family support.  [name] and [name] did not express any concerns with supplies at this time.  Both also reported good family support, however most family lives out of state.  [name] reported his mother was in town for the next 2 weeks.  No significant social concerns for [name] or family.  CSW will continue to follow to offer support while infant in NICU.      VI SOCIAL WORK PLAN  Type of pt/family education:   If child protective services report - county:   If child protective services report - date:   Information/referral to community resources comment:   Other social work plan:    

## 2012-03-01 NOTE — Brief Op Note (Signed)
01/24/2012 - 03/01/2012  12:13 AM  PATIENT:  Peggy [name]  37 y.o. female  PRE-OPERATIVE DIAGNOSIS:  Acute abruption/PPROM since 19 week  POST-OPERATIVE DIAGNOSIS:  same  PROCEDURE:  Procedure(s): Repeat CESAREAN SECTION of baby boy at 2341  (N/A)  SURGEON:  Surgeon(s) and Role:    * Loney Laurence, MD - Primary  ANESTHESIA:   spinal  EBL:  Total I/O In: 2500 [I.V.:2500] Out: 800 [Urine:200; Blood:600]  BLOOD ADMINISTERED:none  SPECIMEN:  Source of Specimen:  placenta  DISPOSITION OF SPECIMEN:  PATHOLOGY  COUNTS:  YES  TOURNIQUET:  * No tourniquets in log *  DICTATION: .Note written in EPIC  PLAN OF CARE: Admit to inpatient   PATIENT DISPOSITION:  PACU - hemodynamically stable.   Delay start of Pharmacological VTE agent (>24hrs) due to surgical blood loss or risk of bleeding: not applicable  Complications:  none Medications:  Ancef, Pitocin Findings:  Baby female, Apgars 8,8, weight P; baby crying from birth.  Baby was intubated shortly before going up to NICU.   Normal tubes, ovaries and uterus seen.  Technique:  After adequate spinal anesthesia was achieved, the patient was prepped and draped in usual sterile fashion.  A foley catheter was used to drain the bladder.  A pfannanstiel incision was made with the scalpel and carried down to the fascia with the bovie cautery. The fascia was incised in the midline with the scalpel and carried in a transverse curvilinear manner bilaterally.  The fascia was reflected superiorly and inferiorly off the rectus muscles and the muscles split in the midline.  A bowel free portion of the peritoneum was entered bluntly and then extended in a superior and inferior manner with good visualization of the bowel and bladder.  The Alexis instrument was then placed and the vesico-uterine fascia tented up and incised in a transverse curvilinear manner.  A 2 cm transverse incision was made in the upper portion of the lower uterine segment until  the amnion was exposed.   The incision was extended transversely in a blunt manner.  Clear fluid was noted and the baby delivered in the vertex presentation without complication.  The baby was bulb suctioned and the cord was clamped and cut.  The baby was then handed to awaiting Neonatology.  The placenta was then delivered manually and the uterus cleared of all debris.  The uterine incision was then closed with a running lock stitch of 0 monocryl.  An imbricating layer of 0 monocryl was closed as well. Good hemostasis of the uterine incision was achieved and the abdomen was cleared with irrigation.  The peritoneum was closed with a running stitch of 2-0 vicryl.  This incorporated the rectus muscles as a separate layer.  The fascia was then closed with a running stitch of 0 vicryl.  The subcutaneous layer was closed with interrupted  stitches of 2-0 plain gut.  The skin was closed with a subcuticular stitch of 4-0 vicryl and dermabond.  The patient tolerated the procedure well and was returned to the recovery room in stable condition.  All counts were correct times three.  Aniayah Alaniz A

## 2012-03-01 NOTE — Transfer of Care (Signed)
Immediate Anesthesia Transfer of Care Note  Patient: Peggy [name]  Procedure(s) Performed: Procedure(s): Repeat CESAREAN SECTION of baby boy at 2341 APGAR 8/8 (N/A)  Patient Location: PACU  Anesthesia Type:Spinal  Level of Consciousness: awake, alert  and oriented  Airway & Oxygen Therapy: Patient Spontanous Breathing  Post-op Assessment: Report given to PACU RN  Post vital signs: Reviewed  Complications: No apparent anesthesia complications

## 2012-03-01 NOTE — Progress Notes (Addendum)
  Patient is eating, ambulating, foley in place.  Pain control is good.  Filed Vitals:   03/01/12 0406 03/01/12 0430 03/01/12 0530 03/01/12 0727  BP: 109/76  103/66 98/61  Pulse: 79  78 73  Temp: 98.6 F (37 C)  98.6 F (37 C) 98.3 F (36.8 C)  TempSrc:   Oral Oral  Resp: 18  16 16   Height:      Weight:      SpO2: 97% 96% 98% 97%    lungs:   clear to auscultation cor:    RRR Abdomen:  soft, appropriate tenderness, incisions intact and without erythema or exudate ex:    no cords   Lab Results  Component Value Date   WBC 17.0* 03/01/2012   HGB 10.6* 03/01/2012   HCT 32.6* 03/01/2012   MCV 82.3 03/01/2012   PLT 206 03/01/2012    --/--/A POS (02/15 2225)/RI  A/P    Post operative day 1.  Routine post op and postpartum care.  H/H is stable.  Percocet for pain control. D/c foley.  PT for recovering from prolonged bedrest.

## 2012-03-01 NOTE — Op Note (Addendum)
01/24/2012 - 03/01/2012  12:13 AM  PATIENT:  Peggy [name]  37 y.o. female  PRE-OPERATIVE DIAGNOSIS:  Acute abruption/PPROM since 19 week  POST-OPERATIVE DIAGNOSIS:  same  PROCEDURE:  Procedure(s): Repeat CESAREAN SECTION of baby boy at 2341  (N/A)  SURGEON:  Surgeon(s) and Role:    * Loney Laurence, MD - Primary  ANESTHESIA:   spinal  EBL:  Total I/O In: 2500 [I.V.:2500] Out: 800 [Urine:200; Blood:600]  BLOOD ADMINISTERED:none  SPECIMEN:  Source of Specimen:  placenta  DISPOSITION OF SPECIMEN:  PATHOLOGY  COUNTS:  YES  TOURNIQUET:  * No tourniquets in log *  DICTATION: .Note written in EPIC  PLAN OF CARE: Admit to inpatient   PATIENT DISPOSITION:  PACU - hemodynamically stable.   Delay start of Pharmacological VTE agent (>24hrs) due to surgical blood loss or risk of bleeding: not applicable  Complications:  none Medications:  Ancef, Pitocin Findings:  Baby female, Apgars 8,8, weight P; baby crying from birth.  Baby was intubated shortly before going up to NICU.   Normal tubes, ovaries and uterus seen.  There was old and new clot seen on placental surface and it detached almost immediately.  Technique:  After adequate spinal anesthesia was achieved, the patient was prepped and draped in usual sterile fashion.  A foley catheter was used to drain the bladder.  A pfannanstiel incision was made with the scalpel and carried down to the fascia with the bovie cautery. The fascia was incised in the midline with the scalpel and carried in a transverse curvilinear manner bilaterally.  The fascia was reflected superiorly and inferiorly off the rectus muscles and the muscles split in the midline.  A bowel free portion of the peritoneum was entered bluntly and then extended in a superior and inferior manner with good visualization of the bowel and bladder.  The Alexis instrument was then placed and the vesico-uterine fascia tented up and incised in a transverse curvilinear manner.  A 2  cm transverse incision was made in the upper portion of the lower uterine segment until the amnion was exposed.   The incision was extended transversely in a blunt manner.  Clear fluid was noted and the baby delivered in the vertex presentation without complication.  The baby was bulb suctioned and the cord was clamped and cut.  The baby was then handed to awaiting Neonatology.  The placenta was then delivered manually and the uterus cleared of all debris.  The uterine incision was then closed with a running lock stitch of 0 monocryl.  An imbricating layer of 0 monocryl was closed as well. Good hemostasis of the uterine incision was achieved and the abdomen was cleared with irrigation.  The peritoneum was closed with a running stitch of 2-0 vicryl.  This incorporated the rectus muscles as a separate layer.  The fascia was then closed with a running stitch of 0 vicryl.  The subcutaneous layer was closed with interrupted  stitches of 2-0 plain gut.  The skin was closed with a subcuticular stitch of 4-0 vicryl and dermabond.  The patient tolerated the procedure well and was returned to the recovery room in stable condition.  All counts were correct times three.  Jamae Tison A

## 2012-03-01 NOTE — Progress Notes (Signed)
Pt ambulated to bathroom, peri care performed, pads changed.  Pt tolerated well.

## 2012-03-02 ENCOUNTER — Encounter (HOSPITAL_COMMUNITY): Payer: Self-pay | Admitting: *Deleted

## 2012-03-02 NOTE — Progress Notes (Signed)
Peggy [name] was in good spirits, though tired.  She had been hoping to keep her baby in until 30 weeks and we spent some time processing that.  She shared the mix of emotion that she is experiencing as she anticipates going home and as she anticipates a long NICU stay for her baby.    I offered spiritual companionship and affirmation.  We will continue to follow up with Peggy [name] and her family as we see them in the NICU, but please also page as needs arise.  [employer] Pager, [phone] 10:55 AM   03/02/12 1000  Clinical Encounter Type  Visited With Patient  Visit Type Follow-up;Spiritual support

## 2012-03-02 NOTE — Progress Notes (Signed)
Post Op Day 2 Subjective: no complaints  Objective: Blood pressure 100/65, pulse 80, temperature 98.2 F (36.8 C), temperature source Oral, resp. rate 18, height 5\' 7"  (1.702 m), weight 82.101 kg (181 lb), last menstrual period 09/29/2011, SpO2 96.00%, unknown if currently breastfeeding.  Physical Exam:  General: alert Lochia: appropriate Uterine Fundus: firm Incision: healing well   Recent Labs  02/29/12 2217 03/01/12 0540  HGB 11.1* 10.6*  HCT 33.8* 32.6*    Assessment/Plan: Plan for discharge tomorrow, Baby critical but stable in NICU.     LOS: 38 days   Zhanna Melin D 03/02/2012, 9:12 AM

## 2012-03-02 NOTE — Progress Notes (Signed)
UR chart review completed.  

## 2012-03-03 MED ORDER — IBUPROFEN 600 MG PO TABS: 600.0000 mg | ORAL_TABLET | Freq: Four times a day (QID) | ORAL | Status: DC | PRN

## 2012-03-03 MED ORDER — DOCUSATE SODIUM 100 MG PO CAPS: 100.0000 mg | ORAL_CAPSULE | Freq: Two times a day (BID) | ORAL | Status: DC

## 2012-03-03 MED ORDER — OXYCODONE-ACETAMINOPHEN 5-325 MG PO TABS: 2.0000 | ORAL_TABLET | ORAL | Status: DC | PRN

## 2012-03-03 NOTE — Progress Notes (Signed)
Subjective: Postpartum Day 3: Cesarean Delivery Patient reports Doing well, pain controlled  Objective: Vital signs in last 24 hours: Filed Vitals:   03/02/12 0510 03/02/12 1400 03/02/12 1721 03/03/12 0546  BP: 100/65 100/70 102/68 109/74  Pulse: 80 93 92 79  Temp: 98.2 F (36.8 C) 98.2 F (36.8 C) 98.9 F (37.2 C) 97.4 F (36.3 C)  TempSrc: Oral Oral Oral Oral  Resp: 18 18 20 16   Height:      Weight:      SpO2:  95% 97%     Physical Exam:  General: alert, cooperative and appears stated age 7: appropriate Uterine Fundus: firm Incision: healing well DVT Evaluation: No evidence of DVT seen on physical exam.   Recent Labs  02/29/12 2217 03/01/12 0540  HGB 11.1* 10.6*  HCT 33.8* 32.6*    Assessment/Plan: Status post Cesarean section. Doing well postoperatively.  Continue current care. Interested in going home this afternoon  Tran Randle H. 03/03/2012, 8:58 AM

## 2012-03-03 NOTE — Discharge Summary (Signed)
Obstetric Discharge Summary Reason for Admission: rupture of membranes Prenatal Procedures: NST, ultrasound and BMZ, antenatal hospitalization since 23+3 weeks Intrapartum Procedures: cesarean: low cervical, transverse Postpartum Procedures: none Complications-Operative and Postpartum: none Hemoglobin  Date Value Range Status  03/01/2012 10.6* 12.0 - 15.0 g/dL Final     HCT  Date Value Range Status  03/01/2012 32.6* 36.0 - 46.0 % Final    Physical Exam:  General: alert, cooperative and appears stated age 37: appropriate Uterine Fundus: firm Incision: healing well DVT Evaluation: No evidence of DVT seen on physical exam.  Discharge Diagnoses: PROM x1680 hours, Preterm delivery, abruption, emergency cesarean section  Discharge Information: Date: 03/03/2012 Activity: pelvic rest Diet: routine Medications: Ibuprofen, Colace and Percocet Condition: improved Instructions: refer to practice specific booklet Discharge to: home Follow-up Information   Follow up with HORVATH,MICHELLE A, MD In 2 weeks. (For an incision check)    Contact information:   [address] [place] [address] [phone]       Follow up with HORVATH,MICHELLE A, MD In 4 weeks. (For a post partum evaluation)    Contact information:   [address] [place] [address] [phone]       Newborn Data: Live born female  Birth Weight: 2 lb 10.3 oz (1199 g) APGAR: 8, 8  Home with NICU.  Peggy [name]. 03/03/2012, 9:32 AM

## 2012-03-03 NOTE — Progress Notes (Signed)
PT Note Received PT order on 03-01-12.  Unfortunately, order was overlooked by therapy staff.  Spoke with nursing this morning.  Patient is ambulatory and walking and does not appear to need PT at this time.  Patient is planning to discharge today Will sign off.  Please reconsult if anything changes. 03/03/2012 Corlis Hove, PT [phone]

## 2012-03-03 NOTE — Progress Notes (Signed)
03/03/12 1400  Clinical Encounter Type  Visited With Patient  Visit Type Follow-up;Spiritual support;Social support  Spiritual Encounters  Spiritual Needs Emotional   Followed up with Peggy [name] prior to her discharge.  She was tired and experiencing the natural mixed feelings about getting to be reunited with her family at home while also having to leave her baby in the NICU.  Provided affirmation and encouragement.  Will follow family in the NICU, but please page as needed:  [phone].  [address] [place], [place] [phone]

## 2012-03-04 ENCOUNTER — Encounter (HOSPITAL_COMMUNITY)
Admission: RE | Admit: 2012-03-04 | Discharge: 2012-03-04 | Disposition: A | Discharge status: Home / Self Care | Payer: Managed Care, Other (non HMO) | Source: Ambulatory Visit | Attending: Obstetrics and Gynecology | Admitting: Obstetrics and Gynecology

## 2012-03-04 DIAGNOSIS — O923 Agalactia: Secondary | ICD-10-CM | POA: Insufficient documentation

## 2012-03-04 LAB — TYPE AND SCREEN
ABO/RH(D): A POS
Unit division: 0

## 2012-03-04 NOTE — Progress Notes (Signed)
Post discharge chart review completed.  

## 2012-04-02 ENCOUNTER — Encounter (HOSPITAL_COMMUNITY)
Admission: RE | Admit: 2012-04-02 | Discharge: 2012-04-02 | Disposition: A | Discharge status: Home / Self Care | Payer: Managed Care, Other (non HMO) | Source: Ambulatory Visit | Attending: Obstetrics and Gynecology | Admitting: Obstetrics and Gynecology

## 2012-04-02 DIAGNOSIS — O923 Agalactia: Secondary | ICD-10-CM | POA: Insufficient documentation

## 2012-04-17 ENCOUNTER — Other Ambulatory Visit: Payer: Self-pay | Admitting: Obstetrics and Gynecology

## 2012-05-03 ENCOUNTER — Encounter (HOSPITAL_COMMUNITY)
Admission: RE | Admit: 2012-05-03 | Discharge: 2012-05-03 | Disposition: A | Discharge status: Home / Self Care | Payer: Managed Care, Other (non HMO) | Source: Ambulatory Visit | Attending: Obstetrics and Gynecology | Admitting: Obstetrics and Gynecology

## 2012-05-03 DIAGNOSIS — O923 Agalactia: Secondary | ICD-10-CM | POA: Insufficient documentation

## 2012-09-23 ENCOUNTER — Other Ambulatory Visit: Payer: Self-pay

## 2012-11-24 ENCOUNTER — Inpatient Hospital Stay (HOSPITAL_COMMUNITY)
Admission: AD | Admit: 2012-11-24 | Discharge: 2012-11-24 | Disposition: A | Discharge status: Home / Self Care | Payer: Managed Care, Other (non HMO) | Source: Ambulatory Visit | Attending: Obstetrics and Gynecology | Admitting: Obstetrics and Gynecology

## 2012-11-24 ENCOUNTER — Inpatient Hospital Stay (HOSPITAL_COMMUNITY): Payer: Managed Care, Other (non HMO)

## 2012-11-24 DIAGNOSIS — N898 Other specified noninflammatory disorders of vagina: Secondary | ICD-10-CM | POA: Insufficient documentation

## 2012-11-24 DIAGNOSIS — R112 Nausea with vomiting, unspecified: Secondary | ICD-10-CM

## 2012-11-24 DIAGNOSIS — R109 Unspecified abdominal pain: Secondary | ICD-10-CM | POA: Insufficient documentation

## 2012-11-24 LAB — CBC
HCT: 33 % — ABNORMAL LOW (ref 36.0–46.0)
Hemoglobin: 9.9 g/dL — ABNORMAL LOW (ref 12.0–15.0)
MCHC: 30 g/dL (ref 30.0–36.0)

## 2012-11-24 LAB — WET PREP, GENITAL
Clue Cells Wet Prep HPF POC: NONE SEEN
Trich, Wet Prep: NONE SEEN

## 2012-11-24 LAB — URINALYSIS, ROUTINE W REFLEX MICROSCOPIC
Glucose, UA: NEGATIVE mg/dL
Hgb urine dipstick: NEGATIVE
Protein, ur: NEGATIVE mg/dL

## 2012-11-24 LAB — POCT PREGNANCY, URINE: Preg Test, Ur: NEGATIVE

## 2012-11-24 MED ORDER — KETOROLAC TROMETHAMINE 60 MG/2ML IM SOLN
60.0000 mg | Freq: Once | INTRAMUSCULAR | Status: AC
  Administered 2012-11-24: 60 mg via INTRAMUSCULAR
  Filled 2012-11-24: qty 2

## 2012-11-24 MED ORDER — ONDANSETRON 4 MG PO TBDP
4.0000 mg | ORAL_TABLET | Freq: Once | ORAL | Status: AC
  Administered 2012-11-24: 4 mg via ORAL
  Filled 2012-11-24: qty 1

## 2012-11-24 NOTE — MAU Note (Signed)
Patient states she has been having spotting almost daily for the past 3 weeks. States she has had abdominal pain that started today and has had vomiting for the past 2 days.

## 2012-11-24 NOTE — MAU Note (Signed)
Pt states she is having abdominal pain and vomiting

## 2012-11-24 NOTE — MAU Provider Note (Signed)
History     CSN: [id number]  Arrival date and time: 11/24/12 1719   First Provider Initiated Contact with Patient 11/24/12 1801      Chief Complaint  Patient presents with  . Possible Pregnancy  . Vaginal Discharge  . Abdominal Pain  . Emesis   HPI  Ms. Peggy [name] is a 37 y.o. female G2P0202 who presents with nausea, lower abdominal pain, and spotting that has been off and on for 1 month. The abdominal pain started today, along with the nausea. She has vomited 4 times today, denies diarrhea.  No new sexual partners recently. She has not taken anything for the abdominal pain.    OB History   Grav Para Term Preterm Abortions TAB SAB Ect Mult Living   2 2  2      2       Past Medical History  Diagnosis Date  . Fibromyalgia   . Infertility management     Past Surgical History  Procedure Laterality Date  . Cesarean section    . Tonsillectomy    . Foot surgery      right foot  . Cardiac catheterization  approx 8 years ago    for chest pain,   . Cesarean section N/A 02/29/2012    Procedure: Repeat CESAREAN SECTION of baby boy at 2341 APGAR 8/8;  Surgeon: Loney Laurence, MD;  Location: [facility];  Service: Obstetrics;  Laterality: N/A;    Family History  Problem Relation Age of Onset  . Diabetes Maternal Grandmother   . Heart disease Maternal Grandmother   . Heart disease Maternal Grandfather     History  Substance Use Topics  . Smoking status: Never Smoker   . Smokeless tobacco: Never Used  . Alcohol Use: Yes     Comment: 1-2 drinks per month.    Allergies: No Known Allergies  Prescriptions prior to admission  Medication Sig Dispense Refill  . docusate sodium (COLACE) 100 MG capsule Take 1 capsule (100 mg total) by mouth 2 (two) times daily.  60 capsule  0  . ibuprofen (ADVIL,MOTRIN) 600 MG tablet Take 1 tablet (600 mg total) by mouth every 6 (six) hours as needed for pain.  90 tablet  0  . oxyCODONE-acetaminophen (ROXICET) 5-325 MG per tablet Take 2  tablets by mouth every 4 (four) hours as needed for pain. May take 1-2 tablets every 4-6 hours as needed for pain  46 tablet  0  . Prenatal Vit-Fe Fumarate-FA (PRENATAL MULTIVITAMIN) TABS Take 1 tablet by mouth daily.       Results for orders placed during the hospital encounter of 11/24/12 (from the past 24 hour(s))  URINALYSIS, ROUTINE W REFLEX MICROSCOPIC     Status: Abnormal   Collection Time    11/24/12  5:40 PM      Result Value Range   Color, Urine YELLOW  YELLOW   APPearance CLEAR  CLEAR   Specific Gravity, Urine >1.030 (*) 1.005 - 1.030   pH 5.5  5.0 - 8.0   Glucose, UA NEGATIVE  NEGATIVE mg/dL   Hgb urine dipstick NEGATIVE  NEGATIVE   Bilirubin Urine NEGATIVE  NEGATIVE   Ketones, ur NEGATIVE  NEGATIVE mg/dL   Protein, ur NEGATIVE  NEGATIVE mg/dL   Urobilinogen, UA 0.2  0.0 - 1.0 mg/dL   Nitrite NEGATIVE  NEGATIVE   Leukocytes, UA NEGATIVE  NEGATIVE  POCT PREGNANCY, URINE     Status: None   Collection Time    11/24/12  5:41  PM      Result Value Range   Preg Test, Ur NEGATIVE  NEGATIVE  CBC     Status: Abnormal   Collection Time    11/24/12  6:05 PM      Result Value Range   WBC 9.4  4.0 - 10.5 K/uL   RBC 4.83  3.87 - 5.11 MIL/uL   Hemoglobin 9.9 (*) 12.0 - 15.0 g/dL   HCT 09.8 (*) 11.9 - 14.7 %   MCV 68.3 (*) 78.0 - 100.0 fL   MCH 20.5 (*) 26.0 - 34.0 pg   MCHC 30.0  30.0 - 36.0 g/dL   RDW 82.9 (*) 56.2 - 13.0 %   Platelets 302  150 - 400 K/uL  WET PREP, GENITAL     Status: Abnormal   Collection Time    11/24/12  8:20 PM      Result Value Range   Yeast Wet Prep HPF POC NONE SEEN  NONE SEEN   Trich, Wet Prep NONE SEEN  NONE SEEN   Clue Cells Wet Prep HPF POC NONE SEEN  NONE SEEN   WBC, Wet Prep HPF POC FEW (*) NONE SEEN    Results for orders placed during the hospital encounter of 11/24/12 (from the past 24 hour(s))  URINALYSIS, ROUTINE W REFLEX MICROSCOPIC     Status: Abnormal   Collection Time    11/24/12  5:40 PM      Result Value Range   Color, Urine  YELLOW  YELLOW   APPearance CLEAR  CLEAR   Specific Gravity, Urine >1.030 (*) 1.005 - 1.030   pH 5.5  5.0 - 8.0   Glucose, UA NEGATIVE  NEGATIVE mg/dL   Hgb urine dipstick NEGATIVE  NEGATIVE   Bilirubin Urine NEGATIVE  NEGATIVE   Ketones, ur NEGATIVE  NEGATIVE mg/dL   Protein, ur NEGATIVE  NEGATIVE mg/dL   Urobilinogen, UA 0.2  0.0 - 1.0 mg/dL   Nitrite NEGATIVE  NEGATIVE   Leukocytes, UA NEGATIVE  NEGATIVE  POCT PREGNANCY, URINE     Status: None   Collection Time    11/24/12  5:41 PM      Result Value Range   Preg Test, Ur NEGATIVE  NEGATIVE  CBC     Status: Abnormal   Collection Time    11/24/12  6:05 PM      Result Value Range   WBC 9.4  4.0 - 10.5 K/uL   RBC 4.83  3.87 - 5.11 MIL/uL   Hemoglobin 9.9 (*) 12.0 - 15.0 g/dL   HCT 86.5 (*) 78.4 - 69.6 %   MCV 68.3 (*) 78.0 - 100.0 fL   MCH 20.5 (*) 26.0 - 34.0 pg   MCHC 30.0  30.0 - 36.0 g/dL   RDW 29.5 (*) 28.4 - 13.2 %   Platelets 302  150 - 400 K/uL  WET PREP, GENITAL     Status: Abnormal   Collection Time    11/24/12  8:20 PM      Result Value Range   Yeast Wet Prep HPF POC NONE SEEN  NONE SEEN   Trich, Wet Prep NONE SEEN  NONE SEEN   Clue Cells Wet Prep HPF POC NONE SEEN  NONE SEEN   WBC, Wet Prep HPF POC FEW (*) NONE SEEN   US Transvaginal Non-ob  11/24/2012   CLINICAL DATA:  Pain and spotting. Previous C-section. LMP 10/28/2012.  EXAM: TRANSABDOMINAL AND TRANSVAGINAL ULTRASOUND OF PELVIS  TECHNIQUE: Both transabdominal and transvaginal ultrasound examinations of the  pelvis were performed. Transabdominal technique was performed for global imaging of the pelvis including uterus, ovaries, adnexal regions, and pelvic cul-de-sac. It was necessary to proceed with endovaginal exam following the transabdominal exam to visualize the endometrium and ovaries.  COMPARISON:  None  FINDINGS: Uterus  Measurements: 6.3 x 4.2 x 11.1cm. Mild heterogeneous echotexture without definite focal mass. There is a tiny linear hypoechoic focus  along the anterior para nature region likely a C-section scar. Small nabothian cyst is present.  Endometrium  Thickness: 8.1 mm.  No focal abnormality visualized.  Right ovary  Measurements: 2.3 x 2.6 x 3.2 cm. Normal appearance/no adnexal mass.  Left ovary  Measurements: 2.9 x 3.7 x 3.7 cm. Normal appearance/no adnexal mass.  Other findings  No free fluid.  IMPRESSION: No acute findings.   Electronically Signed   By: Elberta Fortis M.D.   On: 11/24/2012 19:58   US Pelvis Complete  11/24/2012   CLINICAL DATA:  Pain and spotting. Previous C-section. LMP 10/28/2012.  EXAM: TRANSABDOMINAL AND TRANSVAGINAL ULTRASOUND OF PELVIS  TECHNIQUE: Both transabdominal and transvaginal ultrasound examinations of the pelvis were performed. Transabdominal technique was performed for global imaging of the pelvis including uterus, ovaries, adnexal regions, and pelvic cul-de-sac. It was necessary to proceed with endovaginal exam following the transabdominal exam to visualize the endometrium and ovaries.  COMPARISON:  None  FINDINGS: Uterus  Measurements: 6.3 x 4.2 x 11.1cm. Mild heterogeneous echotexture without definite focal mass. There is a tiny linear hypoechoic focus along the anterior para nature region likely a C-section scar. Small nabothian cyst is present.  Endometrium  Thickness: 8.1 mm.  No focal abnormality visualized.  Right ovary  Measurements: 2.3 x 2.6 x 3.2 cm. Normal appearance/no adnexal mass.  Left ovary  Measurements: 2.9 x 3.7 x 3.7 cm. Normal appearance/no adnexal mass.  Other findings  No free fluid.  IMPRESSION: No acute findings.   Electronically Signed   By: Elberta Fortis M.D.   On: 11/24/2012 19:58    Review of Systems  Constitutional: Positive for chills. Negative for fever.  Gastrointestinal: Positive for nausea, vomiting and abdominal pain. Negative for diarrhea and constipation.  Genitourinary: Negative for dysuria, urgency, frequency and hematuria.       No vaginal discharge. No vaginal  bleeding. No dysuria.   Neurological: Negative for dizziness.   Physical Exam   Blood pressure 124/89, pulse 93, temperature 98.7 F (37.1 C), temperature source Oral, height 5' 6.75" (1.695 m), weight 84.006 kg (185 lb 3.2 oz), last menstrual period 10/28/2012, SpO2 99.00%.  Physical Exam  Constitutional: She is oriented to person, place, and time. She appears well-developed and well-nourished. No distress.  HENT:  Head: Normocephalic.  Eyes: Pupils are equal, round, and reactive to light.  Neck: Neck supple.  Respiratory: Effort normal.  GI: Soft. She exhibits no distension. There is tenderness. There is no rebound and no guarding.  Left lower quadrant tenderness   Genitourinary: Vaginal discharge found.  Speculum exam: Vagina - Small amount of creamy/ clear discharge, no odor Cervix - No contact bleeding Bimanual exam: Cervix closed, no CMT Uterus non tender, normal size Adnexa non tender, no masses bilaterally, + suprapubic tenderness  GC/Chlam, wet prep done Chaperone present for exam.   Neurological: She is alert and oriented to person, place, and time.  Skin: Skin is warm. She is not diaphoretic.  Psychiatric: Her behavior is normal.    MAU Course  Procedures None   MDM Toradol 60 mg IM Zofran 4 mg PO  Consulted with Dr. Henderson Cloud. Ok to send patient for pelvic US  Wet prep GC/Chlamydia   2103: C/W Dr. Henderson Cloud, ok for dc home now.   Assessment and Plan  Report given to Thressa Sheller CNM who resumes care of the patient   1. Nausea & vomiting   Likely 2/2 viral illness  FU as needed   Tawnya Crook 11/24/2012, 9:01 PM

## 2012-11-25 LAB — GC/CHLAMYDIA PROBE AMP
CT Probe RNA: NEGATIVE
GC Probe RNA: NEGATIVE

## 2013-02-15 ENCOUNTER — Ambulatory Visit (INDEPENDENT_AMBULATORY_CARE_PROVIDER_SITE_OTHER): Payer: BC Managed Care – PPO | Admitting: Family Medicine

## 2013-02-15 ENCOUNTER — Encounter: Payer: Self-pay | Admitting: Family Medicine

## 2013-02-15 VITALS — BP 118/80 | HR 92 | Ht 67.0 in | Wt 181.0 lb

## 2013-02-15 DIAGNOSIS — R140 Abdominal distension (gaseous): Secondary | ICD-10-CM

## 2013-02-15 DIAGNOSIS — R142 Eructation: Secondary | ICD-10-CM

## 2013-02-15 DIAGNOSIS — R141 Gas pain: Secondary | ICD-10-CM

## 2013-02-15 DIAGNOSIS — J301 Allergic rhinitis due to pollen: Secondary | ICD-10-CM

## 2013-02-15 DIAGNOSIS — IMO0001 Reserved for inherently not codable concepts without codable children: Secondary | ICD-10-CM

## 2013-02-15 DIAGNOSIS — R143 Flatulence: Secondary | ICD-10-CM

## 2013-02-15 DIAGNOSIS — M797 Fibromyalgia: Secondary | ICD-10-CM

## 2013-02-15 DIAGNOSIS — Z0000 Encounter for general adult medical examination without abnormal findings: Secondary | ICD-10-CM

## 2013-02-15 LAB — COMPREHENSIVE METABOLIC PANEL
ALBUMIN: 4.3 g/dL (ref 3.5–5.2)
ALT: 19 U/L (ref 0–35)
AST: 19 U/L (ref 0–37)
Alkaline Phosphatase: 44 U/L (ref 39–117)
BUN: 11 mg/dL (ref 6–23)
CALCIUM: 9.6 mg/dL (ref 8.4–10.5)
CO2: 23 meq/L (ref 19–32)
Chloride: 106 mEq/L (ref 96–112)
Creat: 0.75 mg/dL (ref 0.50–1.10)
GLUCOSE: 99 mg/dL (ref 70–99)
POTASSIUM: 4.6 meq/L (ref 3.5–5.3)
SODIUM: 137 meq/L (ref 135–145)
TOTAL PROTEIN: 7.1 g/dL (ref 6.0–8.3)
Total Bilirubin: 0.4 mg/dL (ref 0.2–1.2)

## 2013-02-15 LAB — LIPID PANEL
CHOLESTEROL: 179 mg/dL (ref 0–200)
HDL: 40 mg/dL (ref >39)
LDL Cholesterol: 109 mg/dL — ABNORMAL HIGH (ref 0–99)
TRIGLYCERIDES: 152 mg/dL — AB (ref <150)
Total CHOL/HDL Ratio: 4.5 Ratio
VLDL: 30 mg/dL (ref 0–40)

## 2013-02-15 NOTE — Progress Notes (Signed)
   Subjective:    Patient ID: Peggy [name], female    DOB: 1975-04-24, 38 y.o.   MRN: [id number]  HPI She is here for complete examination. Her main complaint today is abdominal bloating that his intermittent in nature. It usually occurs several hours after eating. She cannot relate this to any particular food. She will occasionally have bloating, vomiting and diarrhea. He also missed her menstrual cycle this month. She has been having periods of heat with sweating of hands and feet. He has an underlying history of fibromyalgia and has seen Dr. Amil Amen in the past. She is now starting to have muscle aches and pains as well as spasms again and is considering going back to see him. She also has a history of questionable MS with a history of optic neuritis in an MRI that did show several questionable lesions. She has no other concerns or complaints. Social and family history were reviewed. She does have a 16-year-old child that was about 3 months premature.   Review of Systems Negative except as above    Objective:   Physical Exam BP 118/80  Pulse 92  Ht 5\' 7"  (1.702 m)  Wt 181 lb (82.101 kg)  BMI 28.34 kg/m2  SpO2 98%  General Appearance:    Alert, cooperative, no distress, appears stated age  Head:    Normocephalic, without obvious abnormality, atraumatic  Eyes:    PERRL, conjunctiva/corneas clear, EOM's intact, fundi    benign  Ears:    Normal TM's and external ear canals  Nose:   Nares normal, mucosa normal, no drainage or sinus   tenderness  Throat:   Lips, mucosa, and tongue normal; teeth and gums normal  Neck:   Supple, no lymphadenopathy;  thyroid:  no   enlargement/tenderness/nodules; no carotid   bruit or JVD  Back:    Spine nontender, no curvature, ROM normal, no CVA     tenderness  Lungs:     Clear to auscultation bilaterally without wheezes, rales or     ronchi; respirations unlabored  Chest Wall:    No tenderness or deformity   Heart:    Regular rate and rhythm, S1 and S2  normal, no murmur, rub   or gallop  Breast Exam:    Deferred to GYN  Abdomen:     Soft,Tender to palpation in the right mid quadrant but no rebound , nondistended, normoactive bowel sounds,    no masses, no hepatosplenomegaly  Genitalia:    Deferred to GYN     Extremities:   No clubbing, cyanosis or edema  Pulses:   2+ and symmetric all extremities  Skin:   Skin color, texture, turgor normal, no rashes or lesions  Lymph nodes:   Cervical, supraclavicular, and axillary nodes normal  Neurologic:   CNII-XII intact, normal strength, sensation and gait; reflexes 2+ and symmetric throughout          Psych:   Normal mood, affect, hygiene and grooming.          Assessment & Plan:  Routine general medical examination at a health care facility - Plan: Comprehensive metabolic panel, Lipid panel  Fibromyalgia muscle pain  Allergic rhinitis due to pollen  Abdominal bloating - Plan: US Abdomen Limited RUQ I also discussed the followup on the possible MS. She will continue to be followed there. She is also going to discussed possibly starting on medication again concerning her fibromyalgia.

## 2013-02-18 ENCOUNTER — Other Ambulatory Visit: Payer: Managed Care, Other (non HMO)

## 2013-02-22 ENCOUNTER — Other Ambulatory Visit: Payer: Managed Care, Other (non HMO)

## 2013-02-23 ENCOUNTER — Ambulatory Visit
Admission: RE | Admit: 2013-02-23 | Discharge: 2013-02-23 | Disposition: A | Discharge status: Home / Self Care | Payer: BC Managed Care – PPO | Source: Ambulatory Visit | Attending: Family Medicine | Admitting: Family Medicine

## 2013-02-23 ENCOUNTER — Encounter: Payer: Self-pay | Admitting: Internal Medicine

## 2013-02-23 DIAGNOSIS — R140 Abdominal distension (gaseous): Secondary | ICD-10-CM

## 2013-02-25 ENCOUNTER — Telehealth: Payer: Self-pay | Admitting: Internal Medicine

## 2013-02-25 ENCOUNTER — Other Ambulatory Visit: Payer: Self-pay | Admitting: Family Medicine

## 2013-02-25 DIAGNOSIS — R140 Abdominal distension (gaseous): Secondary | ICD-10-CM

## 2013-02-25 NOTE — Telephone Encounter (Signed)
Message copied by Florestine Avers on Thu Feb 25, 2013  3:56 PM ------      Message from: Denita Lung      Created: Tue Feb 23, 2013 12:07 PM       Let her know that the result was negative but order a HIDA scan ------

## 2013-02-25 NOTE — Telephone Encounter (Signed)
Pt has an appt February 25 @ 6:45 [facility]

## 2013-03-10 ENCOUNTER — Ambulatory Visit (HOSPITAL_COMMUNITY): Payer: Managed Care, Other (non HMO)

## 2013-03-16 ENCOUNTER — Ambulatory Visit (HOSPITAL_COMMUNITY)
Admission: RE | Admit: 2013-03-16 | Discharge: 2013-03-16 | Disposition: A | Discharge status: Home / Self Care | Payer: BC Managed Care – PPO | Source: Ambulatory Visit | Attending: Family Medicine | Admitting: Family Medicine

## 2013-03-16 DIAGNOSIS — R143 Flatulence: Secondary | ICD-10-CM

## 2013-03-16 DIAGNOSIS — R141 Gas pain: Secondary | ICD-10-CM | POA: Insufficient documentation

## 2013-03-16 DIAGNOSIS — R142 Eructation: Secondary | ICD-10-CM | POA: Insufficient documentation

## 2013-03-16 DIAGNOSIS — R109 Unspecified abdominal pain: Secondary | ICD-10-CM | POA: Insufficient documentation

## 2013-03-16 DIAGNOSIS — R140 Abdominal distension (gaseous): Secondary | ICD-10-CM

## 2013-03-16 DIAGNOSIS — R112 Nausea with vomiting, unspecified: Secondary | ICD-10-CM | POA: Insufficient documentation

## 2013-03-16 MED ORDER — SINCALIDE 5 MCG IJ SOLR
0.0200 ug/kg | Freq: Once | INTRAMUSCULAR | Status: AC
  Administered 2013-03-16: 1.69 ug via INTRAVENOUS

## 2013-03-16 MED ORDER — SINCALIDE 5 MCG IJ SOLR
INTRAMUSCULAR | Status: AC
  Administered 2013-03-16: 1.69 ug via INTRAVENOUS
  Filled 2013-03-16: qty 5

## 2013-03-16 MED ORDER — STERILE WATER FOR INJECTION IJ SOLN
INTRAMUSCULAR | Status: AC
  Filled 2013-03-16: qty 10

## 2013-03-16 MED ORDER — TECHNETIUM TC 99M MEBROFENIN IV KIT
5.0000 | PACK | Freq: Once | INTRAVENOUS | Status: AC | PRN
  Administered 2013-03-16: 5 via INTRAVENOUS

## 2013-03-17 ENCOUNTER — Other Ambulatory Visit: Payer: Self-pay | Admitting: Family Medicine

## 2013-03-17 DIAGNOSIS — R112 Nausea with vomiting, unspecified: Secondary | ICD-10-CM

## 2013-03-17 DIAGNOSIS — R109 Unspecified abdominal pain: Secondary | ICD-10-CM

## 2013-03-25 ENCOUNTER — Ambulatory Visit (INDEPENDENT_AMBULATORY_CARE_PROVIDER_SITE_OTHER): Payer: BC Managed Care – PPO | Admitting: Family Medicine

## 2013-03-25 ENCOUNTER — Encounter: Payer: Self-pay | Admitting: Family Medicine

## 2013-03-25 ENCOUNTER — Ambulatory Visit (HOSPITAL_COMMUNITY): Admission: RE | Admit: 2013-03-25 | Payer: Managed Care, Other (non HMO) | Source: Ambulatory Visit

## 2013-03-25 VITALS — BP 120/84 | HR 80 | Wt 182.0 lb

## 2013-03-25 DIAGNOSIS — R109 Unspecified abdominal pain: Secondary | ICD-10-CM

## 2013-03-25 NOTE — Patient Instructions (Signed)
Take Prilosec regularly for the next several months and if you have a break to let me know. I would probably recommend 6 months

## 2013-03-25 NOTE — Progress Notes (Signed)
   Subjective:    Patient ID: Peggy [name], female    DOB: [birth year], 38 y.o.   MRN: [id number]  HPI She is here for recheck. She has had recent ultrasound and HIDA scans which were negative. She was fine until last week when she did have an episode of a lot of abdominal gas that had a rotten eggs smell. She then vomited and had a bout of diarrhea. She states that these symptoms tend to be recurrent over the last year or so occurring every several months. She has tried over-the-counter medicines like TUMS, Pepto-Bismol, Zantac   Review of Systems     Objective:   Physical Exam Alert and in no distress otherwise not examined      Assessment & Plan:  Pain in the abdomen  recommend she try Prilosec for the next several months to see she can breakfast cycle. If not then referral to GI will be made.

## 2013-09-06 ENCOUNTER — Ambulatory Visit (INDEPENDENT_AMBULATORY_CARE_PROVIDER_SITE_OTHER): Payer: BC Managed Care – PPO | Admitting: Family Medicine

## 2013-09-06 ENCOUNTER — Encounter: Payer: Self-pay | Admitting: Family Medicine

## 2013-09-06 ENCOUNTER — Other Ambulatory Visit: Payer: Self-pay | Admitting: Family Medicine

## 2013-09-06 VITALS — BP 118/80 | HR 74 | Wt 180.0 lb

## 2013-09-06 DIAGNOSIS — K5289 Other specified noninfective gastroenteritis and colitis: Secondary | ICD-10-CM

## 2013-09-06 DIAGNOSIS — K529 Noninfective gastroenteritis and colitis, unspecified: Secondary | ICD-10-CM

## 2013-09-06 LAB — CBC WITH DIFFERENTIAL/PLATELET
BASOS PCT: 0 % (ref 0–1)
Basophils Absolute: 0 10*3/uL (ref 0.0–0.1)
EOS ABS: 0.2 10*3/uL (ref 0.0–0.7)
EOS PCT: 3 % (ref 0–5)
HEMATOCRIT: 27.7 % — AB (ref 36.0–46.0)
HEMOGLOBIN: 8.2 g/dL — AB (ref 12.0–15.0)
Lymphocytes Relative: 38 % (ref 12–46)
Lymphs Abs: 3 10*3/uL (ref 0.7–4.0)
MCH: 17.9 pg — AB (ref 26.0–34.0)
MCHC: 29.6 g/dL — AB (ref 30.0–36.0)
MCV: 60.6 fL — AB (ref 78.0–100.0)
MONO ABS: 0.7 10*3/uL (ref 0.1–1.0)
MONOS PCT: 9 % (ref 3–12)
NEUTROS ABS: 3.9 10*3/uL (ref 1.7–7.7)
Neutrophils Relative %: 50 % (ref 43–77)
Platelets: 405 10*3/uL — ABNORMAL HIGH (ref 150–400)
RBC: 4.57 MIL/uL (ref 3.87–5.11)
RDW: 18.6 % — ABNORMAL HIGH (ref 11.5–15.5)
WBC: 7.8 10*3/uL (ref 4.0–10.5)

## 2013-09-06 LAB — COMPREHENSIVE METABOLIC PANEL
ALBUMIN: 4.2 g/dL (ref 3.5–5.2)
ALK PHOS: 50 U/L (ref 39–117)
ALT: 14 U/L (ref 0–35)
AST: 16 U/L (ref 0–37)
BUN: 12 mg/dL (ref 6–23)
CO2: 22 mEq/L (ref 19–32)
CREATININE: 0.66 mg/dL (ref 0.50–1.10)
Calcium: 8.6 mg/dL (ref 8.4–10.5)
Chloride: 107 mEq/L (ref 96–112)
GLUCOSE: 120 mg/dL — AB (ref 70–99)
POTASSIUM: 4.3 meq/L (ref 3.5–5.3)
Sodium: 138 mEq/L (ref 135–145)
Total Bilirubin: 0.3 mg/dL (ref 0.2–1.2)
Total Protein: 7.2 g/dL (ref 6.0–8.3)

## 2013-09-06 MED ORDER — ONDANSETRON HCL 4 MG PO TABS: 4.0000 mg | ORAL_TABLET | Freq: Three times a day (TID) | ORAL | Status: DC | PRN

## 2013-09-06 NOTE — Patient Instructions (Signed)
Viral Gastroenteritis Viral gastroenteritis is also known as stomach flu. This condition affects the stomach and intestinal tract. It can cause sudden diarrhea and vomiting. The illness typically lasts 3 to 8 days. Most people develop an immune response that eventually gets rid of the virus. While this natural response develops, the virus can make you quite ill. CAUSES  Many different viruses can cause gastroenteritis, such as rotavirus or noroviruses. You can catch one of these viruses by consuming contaminated food or water. You may also catch a virus by sharing utensils or other personal items with an infected person or by touching a contaminated surface. SYMPTOMS  The most common symptoms are diarrhea and vomiting. These problems can cause a severe loss of body fluids (dehydration) and a body salt (electrolyte) imbalance. Other symptoms may include:  Fever.  Headache.  Fatigue.  Abdominal pain. DIAGNOSIS  Your caregiver can usually diagnose viral gastroenteritis based on your symptoms and a physical exam. A stool sample may also be taken to test for the presence of viruses or other infections. TREATMENT  This illness typically goes away on its own. Treatments are aimed at rehydration. The most serious cases of viral gastroenteritis involve vomiting so severely that you are not able to keep fluids down. In these cases, fluids must be given through an intravenous line (IV). HOME CARE INSTRUCTIONS   Drink enough fluids to keep your urine clear or pale yellow. Drink small amounts of fluids frequently and increase the amounts as tolerated.  Ask your caregiver for specific rehydration instructions.  Avoid:  Foods high in sugar.  Alcohol.  Carbonated drinks.  Tobacco.  Juice.  Caffeine drinks.  Extremely hot or cold fluids.  Fatty, greasy foods.  Too much intake of anything at one time.  Dairy products until 24 to 48 hours after diarrhea stops.  You may consume probiotics.  Probiotics are active cultures of beneficial bacteria. They may lessen the amount and number of diarrheal stools in adults. Probiotics can be found in yogurt with active cultures and in supplements.  Wash your hands well to avoid spreading the virus.  Only take over-the-counter or prescription medicines for pain, discomfort, or fever as directed by your caregiver. Do not give aspirin to children. Antidiarrheal medicines are not recommended.  Ask your caregiver if you should continue to take your regular prescribed and over-the-counter medicines.  Keep all follow-up appointments as directed by your caregiver. SEEK IMMEDIATE MEDICAL CARE IF:   You are unable to keep fluids down.  You do not urinate at least once every 6 to 8 hours.  You develop shortness of breath.  You notice blood in your stool or vomit. This may look like coffee grounds.  You have abdominal pain that increases or is concentrated in one small area (localized).  You have persistent vomiting or diarrhea.  You have a fever.  The patient is a child younger than 3 months, and he or she has a fever.  The patient is a child older than 3 months, and he or she has a fever and persistent symptoms.  The patient is a child older than 3 months, and he or she has a fever and symptoms suddenly get worse.  The patient is a baby, and he or she has no tears when crying. MAKE SURE YOU:   Understand these instructions.  Will watch your condition.  Will get help right away if you are not doing well or get worse. Document Released: 12/31/2004 Document Revised: 03/25/2011 Document Reviewed: 10/17/2010   ExitCare Patient Information 2015 [place]. This information is not intended to replace advice given to you by your health care provider. Make sure you discuss any questions you have with your health care provider. Use the nausea medicine as needed. You can take up to 8 Imodium a day. Use a probiotic as well

## 2013-09-06 NOTE — Progress Notes (Signed)
   Subjective:    Patient ID: Peggy [name], female    DOB: 27-Apr-1975, 38 y.o.   MRN: [id number]  HPI She is here for evaluation of a one-week history of nausea, vomiting, diarrhea but no fever, chills, blood or pus in her stool. She did recently travel to  but does not remember eating anything there. There've been no sick contacts. She has not been around any animals other than sick. No medication changes. She is presently being evaluated for the possibility of MS. She has had an MRI as well as LP urine she has tried OTC meds to help with her nausea with little success.   Review of Systems     Objective:   Physical Exam alert and in no distress. Tympanic membranes and canals are normal. Throat is clear. Tonsils are normal. Neck is supple without adenopathy or thyromegaly. Cardiac exam shows a regular sinus rhythm without murmurs or gallops. Lungs are clear to auscultation. Abdominal exam did show active bowel sounds with some tenderness but no true rebound       Assessment & Plan:  Acute gastroenteritis - Plan: ondansetron (ZOFRAN) 4 MG tablet, CBC with Differential, Comprehensive metabolic panel  I will treat her symptoms initially pending results of blood work. Did recommend up to 8 Lomotil per day as well as a probiotic. May need to consider stool for O+P. etc. at a later date.

## 2013-09-07 LAB — HEMOGLOBIN A1C
Hgb A1c MFr Bld: 5.7 % — ABNORMAL HIGH (ref <5.7)
MEAN PLASMA GLUCOSE: 117 mg/dL — AB (ref <117)

## 2013-09-07 MED ORDER — FERROUS SULFATE 325 (65 FE) MG PO TBEC: 325.0000 mg | DELAYED_RELEASE_TABLET | Freq: Two times a day (BID) | ORAL | Status: DC

## 2013-09-07 NOTE — Addendum Note (Signed)
Addended by: Minette Headland A on: 09/07/2013 09:07 AM   Modules accepted: Orders

## 2013-11-15 ENCOUNTER — Encounter: Payer: Self-pay | Admitting: Family Medicine

## 2014-04-14 ENCOUNTER — Encounter: Payer: Self-pay | Admitting: Family Medicine

## 2014-04-14 ENCOUNTER — Ambulatory Visit (INDEPENDENT_AMBULATORY_CARE_PROVIDER_SITE_OTHER): Payer: BLUE CROSS/BLUE SHIELD | Admitting: Family Medicine

## 2014-04-14 VITALS — BP 110/70 | HR 100 | Wt 179.0 lb

## 2014-04-14 DIAGNOSIS — I82401 Acute embolism and thrombosis of unspecified deep veins of right lower extremity: Secondary | ICD-10-CM

## 2014-04-14 DIAGNOSIS — M79661 Pain in right lower leg: Secondary | ICD-10-CM | POA: Diagnosis not present

## 2014-04-14 NOTE — Progress Notes (Signed)
   Subjective:    Patient ID: Peggy [name], female    DOB: 26-Jun-1975, 39 y.o.   MRN: [id number]  HPI She complains of a 9 day history of right mid calf pain. She also complains of some tingling sensation in that area. She states that they can be 8 out of 10 in terms of pain. It only occurs with physical activity. There is no previous history of trauma or overuse. She's had no shortness of breath, chest pain, DOE. She was placed on a ringing in January. She does not smoke. Her medications are unchanged.   Review of Systems     Objective:   Physical Exam Alert and in no distress. Exam of the right lower extremity does show some tenderness to palpation over the gastrocnemius but negative Homans sign. Pulses are normal. Skin appears normal. Edema is present.       Assessment & Plan:  Right calf pain - Plan: Lower Extremity Venous Duplex Right

## 2014-04-15 ENCOUNTER — Ambulatory Visit (HOSPITAL_COMMUNITY)
Admission: RE | Admit: 2014-04-15 | Discharge: 2014-04-15 | Disposition: A | Discharge status: Home / Self Care | Payer: BLUE CROSS/BLUE SHIELD | Source: Ambulatory Visit | Attending: Family Medicine | Admitting: Family Medicine

## 2014-04-15 DIAGNOSIS — M79604 Pain in right leg: Secondary | ICD-10-CM | POA: Insufficient documentation

## 2014-04-15 DIAGNOSIS — M79661 Pain in right lower leg: Secondary | ICD-10-CM

## 2014-04-15 DIAGNOSIS — M7989 Other specified soft tissue disorders: Secondary | ICD-10-CM | POA: Insufficient documentation

## 2014-04-15 MED ORDER — RIVAROXABAN (XARELTO) VTE STARTER PACK (15 & 20 MG): ORAL_TABLET | ORAL | Status: DC

## 2014-04-15 NOTE — Progress Notes (Signed)
VASCULAR LAB PRELIMINARY  PRELIMINARY  PRELIMINARY  PRELIMINARY  Bilateral lower extremity venous duplex completed.    Preliminary report:  Bilateral study completed due to protocol when a patient has a DVT in the ordered extremity. Right:  DVT noted  In the distal to proximal mid calf .  No evidence of superficial thrombosis.  No Baker's cyst. Left:  No evidence of DVT, superficial thrombosis, or Baker's cyst.  Elige Shouse, RVS 04/15/2014, 7:21 PM

## 2014-04-15 NOTE — Addendum Note (Signed)
Addended by: Denita Lung on: 04/15/2014 05:59 PM   Modules accepted: Orders

## 2014-04-19 ENCOUNTER — Ambulatory Visit (INDEPENDENT_AMBULATORY_CARE_PROVIDER_SITE_OTHER): Payer: BLUE CROSS/BLUE SHIELD | Admitting: Family Medicine

## 2014-04-19 ENCOUNTER — Encounter: Payer: Self-pay | Admitting: Family Medicine

## 2014-04-19 VITALS — HR 105

## 2014-04-19 DIAGNOSIS — I82401 Acute embolism and thrombosis of unspecified deep veins of right lower extremity: Secondary | ICD-10-CM | POA: Diagnosis not present

## 2014-04-19 MED ORDER — RIVAROXABAN 20 MG PO TABS: 20.0000 mg | ORAL_TABLET | Freq: Every day | ORAL | Status: DC

## 2014-04-19 NOTE — Patient Instructions (Addendum)
If you get chest pain, shortness breath or coughing up blood, go straight to the ER If you get a bad headache, nausea vomiting up blood, black tarry stools, let me know Talked to her gynecologist about getting off the NuvaRing Heat for 20 minutes 3 times per day. Tylenol for pain. Elevate your leg as much is possible.

## 2014-04-19 NOTE — Progress Notes (Signed)
   Subjective:    Patient ID: Peggy [name], female    DOB: 1975-09-22, 39 y.o.   MRN: [id number]  HPI She is here for a recheck. She was recently diagnosed with DVT. Further history indicates that prior to this event she and her husband did drive a proximal he 6 hours and then go on a cruise and then drove back. He was several days after the drive back that she noted discomfort in her right lower extremity that did tend to go away but then got much worse. She is also on estrogen for birth control. She does not smoke.   Review of Systems     Objective:   Physical Exam Alert and in no distress. The right leg is not warm but definitely tender and not erythematous.       Assessment & Plan:  DVT (deep venous thrombosis), right - Plan: rivaroxaban (XARELTO) 20 MG TABS tablet I discussed the treatment of DVT with her. She will stay on this medication for 6 months. Encouraged her to get a hold of her GYN and stop the present birth control. Discussed side effects of her medication. Gave instructions on possible side effects.

## 2014-05-03 ENCOUNTER — Observation Stay (HOSPITAL_COMMUNITY)
Admission: AD | Admit: 2014-05-03 | Discharge: 2014-05-04 | Disposition: A | Discharge status: Home / Self Care | Payer: BLUE CROSS/BLUE SHIELD | Source: Ambulatory Visit | Attending: Obstetrics and Gynecology | Admitting: Obstetrics and Gynecology

## 2014-05-03 ENCOUNTER — Encounter (HOSPITAL_COMMUNITY): Payer: Self-pay | Admitting: *Deleted

## 2014-05-03 DIAGNOSIS — I82401 Acute embolism and thrombosis of unspecified deep veins of right lower extremity: Secondary | ICD-10-CM

## 2014-05-03 DIAGNOSIS — Z86018 Personal history of other benign neoplasm: Secondary | ICD-10-CM | POA: Diagnosis not present

## 2014-05-03 DIAGNOSIS — Z7901 Long term (current) use of anticoagulants: Secondary | ICD-10-CM | POA: Diagnosis not present

## 2014-05-03 DIAGNOSIS — Z3202 Encounter for pregnancy test, result negative: Secondary | ICD-10-CM | POA: Diagnosis not present

## 2014-05-03 DIAGNOSIS — R000 Tachycardia, unspecified: Secondary | ICD-10-CM | POA: Insufficient documentation

## 2014-05-03 DIAGNOSIS — Z9889 Other specified postprocedural states: Secondary | ICD-10-CM | POA: Insufficient documentation

## 2014-05-03 DIAGNOSIS — N938 Other specified abnormal uterine and vaginal bleeding: Secondary | ICD-10-CM | POA: Diagnosis present

## 2014-05-03 DIAGNOSIS — D62 Acute posthemorrhagic anemia: Principal | ICD-10-CM | POA: Diagnosis present

## 2014-05-03 DIAGNOSIS — Z79899 Other long term (current) drug therapy: Secondary | ICD-10-CM | POA: Diagnosis not present

## 2014-05-03 DIAGNOSIS — R42 Dizziness and giddiness: Secondary | ICD-10-CM | POA: Insufficient documentation

## 2014-05-03 HISTORY — DX: Leiomyoma of uterus, unspecified: D25.9

## 2014-05-03 HISTORY — DX: Acute embolism and thrombosis of unspecified deep veins of unspecified lower extremity: I82.409

## 2014-05-03 LAB — CBC
HCT: 25.8 % — ABNORMAL LOW (ref 36.0–46.0)
Hemoglobin: 7.3 g/dL — ABNORMAL LOW (ref 12.0–15.0)
MCH: 17.7 pg — ABNORMAL LOW (ref 26.0–34.0)
MCHC: 28.3 g/dL — ABNORMAL LOW (ref 30.0–36.0)
MCV: 62.5 fL — ABNORMAL LOW (ref 78.0–100.0)
PLATELETS: 313 10*3/uL (ref 150–400)
RBC: 4.13 MIL/uL (ref 3.87–5.11)
RDW: 20 % — ABNORMAL HIGH (ref 11.5–15.5)
WBC: 9.9 10*3/uL (ref 4.0–10.5)

## 2014-05-03 LAB — PROTIME-INR
INR: 1.76 — ABNORMAL HIGH (ref 0.00–1.49)
Prothrombin Time: 20.7 seconds — ABNORMAL HIGH (ref 11.6–15.2)

## 2014-05-03 LAB — APTT: aPTT: 34 seconds (ref 24–37)

## 2014-05-03 LAB — FIBRINOGEN: Fibrinogen: 408 mg/dL (ref 204–475)

## 2014-05-03 LAB — HCG, SERUM, QUALITATIVE: Preg, Serum: POSITIVE — AB

## 2014-05-03 LAB — PREPARE RBC (CROSSMATCH)

## 2014-05-03 LAB — HCG, QUANTITATIVE, PREGNANCY: hCG, Beta Chain, Quant, S: <1 m[IU]/mL (ref <5)

## 2014-05-03 MED ORDER — SODIUM CHLORIDE 0.9 % IV SOLN
INTRAVENOUS | Status: DC
  Administered 2014-05-03: 20:00:00 via INTRAVENOUS

## 2014-05-03 MED ORDER — ACETAMINOPHEN 325 MG PO TABS
650.0000 mg | ORAL_TABLET | Freq: Once | ORAL | Status: AC
  Administered 2014-05-03: 650 mg via ORAL
  Filled 2014-05-03: qty 2

## 2014-05-03 MED ORDER — HYDROCODONE-ACETAMINOPHEN 5-325 MG PO TABS
2.0000 | ORAL_TABLET | Freq: Once | ORAL | Status: AC
  Administered 2014-05-03: 2 via ORAL
  Filled 2014-05-03: qty 2

## 2014-05-03 MED ORDER — SODIUM CHLORIDE 0.9 % IV SOLN: Freq: Once | INTRAVENOUS | Status: DC

## 2014-05-03 MED ORDER — ACETAMINOPHEN 325 MG PO TABS: 650.0000 mg | ORAL_TABLET | ORAL | Status: DC | PRN

## 2014-05-03 MED ORDER — ONDANSETRON HCL 4 MG/2ML IJ SOLN: 4.0000 mg | Freq: Four times a day (QID) | INTRAMUSCULAR | Status: DC | PRN

## 2014-05-03 MED ORDER — MEGESTROL ACETATE 40 MG PO TABS
40.0000 mg | ORAL_TABLET | Freq: Once | ORAL | Status: AC
  Administered 2014-05-03: 40 mg via ORAL
  Filled 2014-05-03: qty 1

## 2014-05-03 MED ORDER — FERROUS SULFATE 325 (65 FE) MG PO TABS
325.0000 mg | ORAL_TABLET | Freq: Three times a day (TID) | ORAL | Status: DC
  Administered 2014-05-04 (×2): 325 mg via ORAL
  Filled 2014-05-03 (×2): qty 1

## 2014-05-03 MED ORDER — DIPHENHYDRAMINE HCL 25 MG PO CAPS
25.0000 mg | ORAL_CAPSULE | Freq: Once | ORAL | Status: AC
  Administered 2014-05-03: 25 mg via ORAL
  Filled 2014-05-03: qty 1

## 2014-05-03 MED ORDER — ONDANSETRON HCL 4 MG/2ML IJ SOLN
4.0000 mg | Freq: Once | INTRAMUSCULAR | Status: DC
  Filled 2014-05-03: qty 2

## 2014-05-03 MED ORDER — HYDROCODONE-ACETAMINOPHEN 5-325 MG PO TABS
2.0000 | ORAL_TABLET | ORAL | Status: DC | PRN
  Administered 2014-05-04: 2 via ORAL
  Filled 2014-05-03: qty 2

## 2014-05-03 MED ORDER — SODIUM CHLORIDE 0.9 % IV BOLUS (SEPSIS)
500.0000 mL | Freq: Once | INTRAVENOUS | Status: AC
  Administered 2014-05-03: 500 mL via INTRAVENOUS

## 2014-05-03 MED ORDER — MEGESTROL ACETATE 40 MG PO TABS
40.0000 mg | ORAL_TABLET | Freq: Three times a day (TID) | ORAL | Status: DC
  Administered 2014-05-03 – 2014-05-04 (×3): 40 mg via ORAL
  Filled 2014-05-03 (×5): qty 1

## 2014-05-03 MED ORDER — RIVAROXABAN 20 MG PO TABS
20.0000 mg | ORAL_TABLET | Freq: Every day | ORAL | Status: DC
  Administered 2014-05-03: 20 mg via ORAL
  Filled 2014-05-03 (×2): qty 1

## 2014-05-03 MED ORDER — SODIUM CHLORIDE 0.9 % IV SOLN
INTRAVENOUS | Status: DC
  Administered 2014-05-03: 18:00:00 via INTRAVENOUS

## 2014-05-03 MED ORDER — HYDROMORPHONE HCL 1 MG/ML IJ SOLN
1.0000 mg | Freq: Once | INTRAMUSCULAR | Status: AC
  Administered 2014-05-03: 1 mg via INTRAVENOUS
  Filled 2014-05-03: qty 1

## 2014-05-03 MED ORDER — TRAMADOL HCL 50 MG PO TABS: 100.0000 mg | ORAL_TABLET | Freq: Once | ORAL | Status: DC

## 2014-05-03 MED ORDER — ONDANSETRON HCL 4 MG PO TABS: 4.0000 mg | ORAL_TABLET | Freq: Four times a day (QID) | ORAL | Status: DC | PRN

## 2014-05-03 NOTE — MAU Note (Signed)
Pt is more alert now, answering questions more quickly.

## 2014-05-03 NOTE — MAU Note (Signed)
Pt assisted out of car by [facility] into MAU room 8, bleeding through pants,  Pt states she was started on blood thinner on 4/1 for blood clot in her leg.  Period started on Friday, became heavy on Saturday.

## 2014-05-03 NOTE — Progress Notes (Signed)
Pt had wave of pain/nausea during exam. Exam stopped. Will give pain and nausea med and then repeat spec exam

## 2014-05-03 NOTE — H&P (Signed)
39 y.o. yo complains of heavy vaginal bleeding.  Pt has a known h/o menorrhagia for which she underwent US 12/2013 which showed a small 1.5cm fibroid.  She was started on Nuvaring, but then developed a R calf DVT 04/15/2014.  She was taken off Nuvaring and mirena placement has been planned.  She was started on Xerelto for treatment of DVT, and started her cycle which became very heavy soaking pads through to her pants and onto carseat while headed to MAU for evaluation.  She reported feeling dizzy like she was going to pass out. Upon arrival bleeding diminished but pt was tachycardic and dizzy and unable to stand or walk.  Past Medical History  Diagnosis Date  . Fibromyalgia   . Infertility management   . Fibroid uterus   . DVT (deep venous thrombosis)    Past Surgical History  Procedure Laterality Date  . Cesarean section    . Tonsillectomy    . Foot surgery      right foot  . Cardiac catheterization  approx 8 years ago    for chest pain,   . Cesarean section N/A 02/29/2012    Procedure: Repeat CESAREAN SECTION of baby boy at [place] 8/8;  Surgeon: Daria Pastures, MD;  Location: [facility];  Service: Obstetrics;  Laterality: N/A;    History   Social History  . Marital Status: Married    Spouse Name: N/A  . Number of Children: 1  . Years of Education: N/A   Occupational History  . Scientist, research (medical)    Social History Main Topics  . Smoking status: Never Smoker   . Smokeless tobacco: Never Used  . Alcohol Use: Yes     Comment: 1-2 drinks per month.  . Drug Use: No  . Sexual Activity: Yes   Other Topics Concern  . Not on file   Social History Narrative    No current facility-administered medications on file prior to encounter.   Current Outpatient Prescriptions on File Prior to Encounter  Medication Sig Dispense Refill  . rivaroxaban (XARELTO) 20 MG TABS tablet Take 1 tablet (20 mg total) by mouth daily with supper. 90 tablet 1  . Etonogestrel-Ethinyl Estradiol (NUVARING  VA) Place vaginally.    . ferrous sulfate 325 (65 FE) MG EC tablet Take 1 tablet (325 mg total) by mouth 2 (two) times daily. (Patient not taking: Reported on 04/14/2014) 60 tablet 1    No Known Allergies  @VITALS2 @  Lungs: clear to ascultation Cor:  RRR Abdomen:  soft, nontender, nondistended. Ex:  no cords, erythema Pelvic:  Per MAU midwife, minimal active bleeding from cervical os.  A:  Menorrhagia while on Xerelto for tx of DVT   P: Pt received 2L of fluid in MAU, remained symptomatically anemic Dr. Jana Hakim of medical oncology (on call for hematology) was consulted by phone.  He agreed with plan to transfuse 2U and stated no further monitoring of coags was necessary.  Will continue Xeralto.  He also agreed with plan for Megace taper.  Pt was started on 40mg  Megace tid.  She will receive Benadryl and tylenol prior to transfusion and will receive 2U PRBCs overnight with repeat CBC 4 hours after 2nd unit.  The plan is to d/c tomorrow if bleeding is stable with continued magace taper. Other vitals per transfusion protocol Regular diet Emelin Dascenzo

## 2014-05-03 NOTE — Progress Notes (Signed)
Pt answers all questions appropriately but is slow to answer.  Speech is clear.

## 2014-05-03 NOTE — MAU Note (Signed)
Pt denies any other excessive bleeding since starting xarelto, states she has scant gum bleeding when brushing her teeth, no hematuria or bloody stools.

## 2014-05-03 NOTE — Progress Notes (Signed)
BP 129/73, P 112 -Was able to eat crackers and ginger ale.  Checked Bleeding- sm amt. No clots. New pad and underwear.  Orders to ambulate patient.  Pt up to side of bed, sat there a few minutes and attempted to walk with assist but upon standing, felt dizzy and was unable to walk.  Assisted back in bed, rechecked BP:  133/81, P. 99.  Warm blanket given.  Call bell in reach.  Notified [place].

## 2014-05-04 ENCOUNTER — Encounter (HOSPITAL_COMMUNITY): Payer: Self-pay | Admitting: Advanced Practice Midwife

## 2014-05-04 DIAGNOSIS — D62 Acute posthemorrhagic anemia: Secondary | ICD-10-CM

## 2014-05-04 LAB — CBC
HEMATOCRIT: 25.6 % — AB (ref 36.0–46.0)
HEMOGLOBIN: 7.6 g/dL — AB (ref 12.0–15.0)
MCH: 20.1 pg — ABNORMAL LOW (ref 26.0–34.0)
MCHC: 29.7 g/dL — ABNORMAL LOW (ref 30.0–36.0)
MCV: 67.7 fL — AB (ref 78.0–100.0)
Platelets: 242 10*3/uL (ref 150–400)
RBC: 3.78 MIL/uL — ABNORMAL LOW (ref 3.87–5.11)
RDW: 22.2 % — AB (ref 11.5–15.5)
WBC: 8.3 10*3/uL (ref 4.0–10.5)

## 2014-05-04 MED ORDER — MEGESTROL ACETATE 40 MG PO TABS: 40.0000 mg | ORAL_TABLET | Freq: Three times a day (TID) | ORAL | Status: DC

## 2014-05-04 MED ORDER — HYDROCODONE-ACETAMINOPHEN 5-325 MG PO TABS: 2.0000 | ORAL_TABLET | ORAL | Status: DC | PRN

## 2014-05-04 NOTE — Progress Notes (Signed)
Ur chart review completed.  

## 2014-05-04 NOTE — Progress Notes (Signed)
Discharge teaching complete. Pt understood all instructions and did not have any questions. Pt pushed via wheelchair and discharged home to family. 

## 2014-05-04 NOTE — Progress Notes (Signed)
Patient is eating, ambulating, and voiding. Pt's bleeding is decreased on megace.  Pt is feeling better and is stable after 2 units of PRCs. However, bleeding has picked up a little more this am.   BP 100/67 mmHg  Pulse 75  Temp(Src) 98.3 F (36.8 C) (Oral)  Resp 16  SpO2 99%  LMP 04/29/2014 (Exact Date)  lungs:   clear to auscultation cor:    RRR Abdomen:  soft, appropriate tenderness, incisions intact and without erythema or exudate. ex:    no cords   Lab Results  Component Value Date   WBC 9.9 05/03/2014   HGB 7.3* 05/03/2014   HCT 25.8* 05/03/2014   MCV 62.5* 05/03/2014   PLT 313 05/03/2014    A/P  Will continue megace taper and plan for Mirena insert just after taper finished.  H/H post transfusion due at 11 am.  Bleeding slightly more again this am; pt now NPO in case bleeding increases to point it did yesterday and a D&C is necessary.   Will recheck bleeding this pm- if better and stable, will d/c to home with taper.

## 2014-05-04 NOTE — Plan of Care (Signed)
Problem: Phase I Progression Outcomes Goal: OOB as tolerated unless otherwise ordered Outcome: Not Applicable Date Met:  97/98/92 Strict bedrest

## 2014-05-04 NOTE — MAU Provider Note (Signed)
Chief Complaint: Vaginal Bleeding   First Provider Initiated Contact with Patient 05/03/14 1613     SUBJECTIVE HPI: Peggy [name] is a 39 y.o. G2P0202 female who presents to Maternity Admissions reporting heavy vaginal bleeding and passing clots. She has a long Hx of menorrhagia that has been controlled recently w/ Nuvaring, but pt was Dx'd w/ DVT 04/15/14 while on Nuvaring and after long car ride. Started on Xarelto. She removed her Nuvaring ~1 week ago and started her period 4/16. Today it became so heavy that she soaked 16 pads and through her clothing onto her car seat on the way to MAU. She reports nearly passing out at home and again upon arrival to her MAU room.   She also reports low abd cramping that is similar to her usual dysmenorrhea, but gets much worse when she passes a large clot. She denies any other excessive bleeding or bruising.   Pt strongly doubts pregnancy due to husband having had vasectomy.   Past Medical History  Diagnosis Date  . Fibromyalgia   . Infertility management   . Fibroid uterus   . DVT (deep venous thrombosis)    OB History  Gravida Para Term Preterm AB SAB TAB Ectopic Multiple Living  2 2  2      2     # Outcome Date GA Lbr Len/2nd Weight Sex Delivery Anes PTL Lv  2 Preterm 02/29/12 [redacted]w[redacted]d   M CS-LVertical Spinal  Y  1 Preterm              Past Surgical History  Procedure Laterality Date  . Cesarean section    . Tonsillectomy    . Foot surgery      right foot  . Cardiac catheterization  approx 8 years ago    for chest pain,   . Cesarean section N/A 02/29/2012    Procedure: Repeat CESAREAN SECTION of baby boy at [place] 8/8;  Surgeon: Daria Pastures, MD;  Location: [facility];  Service: Obstetrics;  Laterality: N/A;   History   Social History  . Marital Status: Married    Spouse Name: N/A  . Number of Children: 1  . Years of Education: N/A   Occupational History  . Scientist, research (medical)    Social History Main Topics  . Smoking status: Never  Smoker   . Smokeless tobacco: Never Used  . Alcohol Use: Yes     Comment: 1-2 drinks per month.  . Drug Use: No  . Sexual Activity: Yes   Other Topics Concern  . Not on file   Social History Narrative   No current facility-administered medications on file prior to encounter.   Current Outpatient Prescriptions on File Prior to Encounter  Medication Sig Dispense Refill  . rivaroxaban (XARELTO) 20 MG TABS tablet Take 1 tablet (20 mg total) by mouth daily with supper. 90 tablet 1  . Etonogestrel-Ethinyl Estradiol (NUVARING VA) Place vaginally.    . ferrous sulfate 325 (65 FE) MG EC tablet Take 1 tablet (325 mg total) by mouth 2 (two) times daily. (Patient not taking: Reported on 04/14/2014) 60 tablet 1   No Known Allergies  Review of Systems  Constitutional: Negative for fever and chills.  Cardiovascular:       Right low calf tenderness  Gastrointestinal: Positive for nausea, vomiting and abdominal pain. Negative for diarrhea, constipation and blood in stool.  Genitourinary: Negative for dysuria, urgency, frequency and hematuria.       Pos for heavy vaginal bleeding  Musculoskeletal: Negative for falls.  Neurological: Positive for dizziness and weakness. Negative for loss of consciousness.  Endo/Heme/Allergies: Does not bruise/bleed easily.    OBJECTIVE Blood pressure 95/54, pulse 91, temperature 98.6 F (37 C), temperature source Oral, resp. rate 16, last menstrual period 04/29/2014, SpO2 99 %.  Patient Vitals for the past 24 hrs:  BP Temp Temp src Pulse Resp SpO2  05/03/14 2334 (!) 95/54 mmHg 98.6 F (37 C) Oral 91 16 99 %  05/03/14 2307 (!) 106/56 mmHg 98.7 F (37.1 C) Oral 97 16 99 %  05/03/14 2142 104/63 mmHg 99.1 F (37.3 C) Oral 89 16 100 %  05/03/14 2118 112/70 mmHg - - 93 - -  05/03/14 2112 - - - 94 - -  05/03/14 2107 - - - 108 - -  05/03/14 2102 - - - 94 - 96 %  05/03/14 2057 - - - 98 - 97 %  05/03/14 2052 - - - 91 - 97 %  05/03/14 2047 - - - 95 - 98 %   05/03/14 2042 - - - 96 - 97 %  05/03/14 2037 - - - 96 - 98 %  05/03/14 2032 - - - 90 - 98 %  05/03/14 2027 - - - 97 - 98 %  05/03/14 2022 - - - 91 - 97 %  05/03/14 2017 - - - 94 - 99 %  05/03/14 2012 - - - 97 - 97 %  05/03/14 2007 - - - 81 - 100 %  05/03/14 1959 133/81 mmHg 98.6 F (37 C) Oral 99 16 100 %  05/03/14 1954 129/79 mmHg - - 112 16 99 %  05/03/14 1935 - - - - - 98 %  05/03/14 1915 - - - - - 100 %  05/03/14 1735 109/66 mmHg - - 88 18 99 %  05/03/14 1659 111/69 mmHg - - 89 20 100 %  05/03/14 1633 - - - - - 100 %  05/03/14 1631 - - - - - 96 %  05/03/14 1616 110/69 mmHg - - 109 20 93 %  05/03/14 1607 - - - - - 100 %  05/03/14 1603 123/81 mmHg - - 108 24 (!) 81 %   Unable to tolerate Orthostatic VS.  GENERAL: Well-developed, well-nourished female in mild distress. Pale. Weak-appearing.  HEART: Mildly tachycardic. Normal rhythm. RESP: normal effort GI: Abdomen soft, non-tender. Positive bowel sounds 4. MS: Nontender, no edema NEURO: Alert and oriented SPECULUM EXAM: NEFG, moderate amount of bright red blood w/ several moderate-sized clots, cervix clean BIMANUAL: cervix closed; uterus ? Slightly enlarged, no adnexal tenderness or masses. No CMT.  LAB RESULTS Results for orders placed or performed during the hospital encounter of 05/03/14 (from the past 24 hour(s))  CBC     Status: Abnormal   Collection Time: 05/03/14  4:15 PM  Result Value Ref Range   WBC 9.9 4.0 - 10.5 K/uL   RBC 4.13 3.87 - 5.11 MIL/uL   Hemoglobin 7.3 (L) 12.0 - 15.0 g/dL   HCT 25.8 (L) 36.0 - 46.0 %   MCV 62.5 (L) 78.0 - 100.0 fL   MCH 17.7 (L) 26.0 - 34.0 pg   MCHC 28.3 (L) 30.0 - 36.0 g/dL   RDW 20.0 (H) 11.5 - 15.5 %   Platelets 313 150 - 400 K/uL  Type and screen     Status: None (Preliminary result)   Collection Time: 05/03/14  4:15 PM  Result Value Ref Range   ABO/RH(D) A POS  Antibody Screen NEG    Sample Expiration 05/06/2014    Unit Number [id number]    Blood Component  Type RBC LR PHER2    Unit division 00    Status of Unit ISSUED    Transfusion Status OK TO TRANSFUSE    Crossmatch Result Compatible    Unit Number [id number]    Blood Component Type RED CELLS,LR    Unit division 00    Status of Unit ALLOCATED    Transfusion Status OK TO TRANSFUSE    Crossmatch Result Compatible   Fibrinogen (coagulopathy lab panel)     Status: None   Collection Time: 05/03/14  4:15 PM  Result Value Ref Range   Fibrinogen 408 204 - 475 mg/dL  Protime-INR (coagulopathy lab panel)     Status: Abnormal   Collection Time: 05/03/14  4:15 PM  Result Value Ref Range   Prothrombin Time 20.7 (H) 11.6 - 15.2 seconds   INR 1.76 (H) 0.00 - 1.49  APTT (coagulopathy lab panel)     Status: None   Collection Time: 05/03/14  4:15 PM  Result Value Ref Range   aPTT 34 24 - 37 seconds  hCG, serum, qualitative     Status: Abnormal   Collection Time: 05/03/14  4:15 PM  Result Value Ref Range   Preg, Serum POSITIVE (A) NEGATIVE  hCG, quantitative, pregnancy     Status: None   Collection Time: 05/03/14  4:15 PM  Result Value Ref Range   hCG, Beta Chain, Quant, S <1 <5 mIU/mL  Prepare RBC     Status: None   Collection Time: 05/03/14  9:36 PM  Result Value Ref Range   Order Confirmation ORDER PROCESSED BY BLOOD BANK     IMAGING No results found.  MAU COURSE Pt feeling slightly better after 2 liters NSL. Able to tolerate raising HOB to 45 degrees. Bleeding small amount. Discussed labs, exam, Hx w/ Dr. Rogue Bussing. Will start Megace taper and have pt ambulate. D/C home if pt's dizziness and near-syncope have resolved.   Pt very dizzy when nurse had her sit on side of bed. When nurse asked pt to lie back into bed the pt slumped forward. Dr. Rogue Bussing notified. Will admit pt to [facility] for transfusion. Pt agrees to [place].   ASSESSMENT 1. Acute blood loss anemia   2. DVT (deep venous thrombosis), right    PLAN Admit pt to [facility] for transfusion per consult w/ Dr. Rogue Bussing. Megace  taper. Continue Xarelto.  [place], CNM 05/04/2014  12:12 AM

## 2014-05-04 NOTE — Progress Notes (Signed)
Filed Vitals:   05/04/14 0340 05/04/14 0701 05/04/14 1200 05/04/14 1228  BP: 92/51 100/67 99/57   Pulse: 78 75 88   Temp: 98.6 F (37 C) 98.3 F (36.8 C) 98.4 F (36.9 C)   TempSrc: Oral Oral Oral   Resp: 16 16 18    Height:    5\' 7"  (1.702 m)  Weight:    80.74 kg (178 lb)  SpO2: 100% 99% 100%    Results for orders placed or performed during the hospital encounter of 05/03/14 (from the past 24 hour(s))  CBC     Status: Abnormal   Collection Time: 05/03/14  4:15 PM  Result Value Ref Range   WBC 9.9 4.0 - 10.5 K/uL   RBC 4.13 3.87 - 5.11 MIL/uL   Hemoglobin 7.3 (L) 12.0 - 15.0 g/dL   HCT 25.8 (L) 36.0 - 46.0 %   MCV 62.5 (L) 78.0 - 100.0 fL   MCH 17.7 (L) 26.0 - 34.0 pg   MCHC 28.3 (L) 30.0 - 36.0 g/dL   RDW 20.0 (H) 11.5 - 15.5 %   Platelets 313 150 - 400 K/uL  Type and screen     Status: None (Preliminary result)   Collection Time: 05/03/14  4:15 PM  Result Value Ref Range   ABO/RH(D) A POS    Antibody Screen NEG    Sample Expiration 05/06/2014    Unit Number [id number]    Blood Component Type RBC LR PHER2    Unit division 00    Status of Unit ISSUED,FINAL    Transfusion Status OK TO TRANSFUSE    Crossmatch Result Compatible    Unit Number [id number]    Blood Component Type RED CELLS,LR    Unit division 00    Status of Unit ISSUED    Transfusion Status OK TO TRANSFUSE    Crossmatch Result Compatible   Fibrinogen (coagulopathy lab panel)     Status: None   Collection Time: 05/03/14  4:15 PM  Result Value Ref Range   Fibrinogen 408 204 - 475 mg/dL  Protime-INR (coagulopathy lab panel)     Status: Abnormal   Collection Time: 05/03/14  4:15 PM  Result Value Ref Range   Prothrombin Time 20.7 (H) 11.6 - 15.2 seconds   INR 1.76 (H) 0.00 - 1.49  APTT (coagulopathy lab panel)     Status: None   Collection Time: 05/03/14  4:15 PM  Result Value Ref Range   aPTT 34 24 - 37 seconds  hCG, serum, qualitative     Status: Abnormal   Collection Time: 05/03/14  4:15 PM   Result Value Ref Range   Preg, Serum POSITIVE (A) NEGATIVE  hCG, quantitative, pregnancy     Status: None   Collection Time: 05/03/14  4:15 PM  Result Value Ref Range   hCG, Beta Chain, Quant, S <1 <5 mIU/mL  Prepare RBC     Status: None   Collection Time: 05/03/14  9:36 PM  Result Value Ref Range   Order Confirmation ORDER PROCESSED BY BLOOD BANK   CBC     Status: Abnormal   Collection Time: 05/04/14 11:25 AM  Result Value Ref Range   WBC 8.3 4.0 - 10.5 K/uL   RBC 3.78 (L) 3.87 - 5.11 MIL/uL   Hemoglobin 7.6 (L) 12.0 - 15.0 g/dL   HCT 25.6 (L) 36.0 - 46.0 %   MCV 67.7 (L) 78.0 - 100.0 fL   MCH 20.1 (L) 26.0 - 34.0 pg   MCHC 29.7 (L) 30.0 -  36.0 g/dL   RDW 22.2 (H) 11.5 - 15.5 %   Platelets 242 150 - 400 K/uL    Hemodynamically stable.  Bleeding is reasonable on megace- only one small clot in one hour.  Like a regular.  D/C to home.

## 2014-05-05 LAB — TYPE AND SCREEN
ABO/RH(D): A POS
ANTIBODY SCREEN: NEGATIVE
Unit division: 0
Unit division: 0

## 2014-05-12 NOTE — Discharge Summary (Signed)
Physician Discharge Summary  Patient ID: Peggy [name] MRN: [id number] DOB/AGE: July 14, 1975 39 y.o.  Admit date: 05/03/2014 Discharge date: 05/12/2014  Admission Diagnoses:  Discharge Diagnoses:  Active Problems:   Anemia due to blood loss, acute   Discharged Condition: good  Hospital Course: Uncomplicated blood transfusion and administration of megace.  Consults: None  Significant Diagnostic Studies: labs:  Recent Results (from the past 2160 hour(s))  CBC     Status: Abnormal   Collection Time: 05/03/14  4:15 PM  Result Value Ref Range   WBC 9.9 4.0 - 10.5 K/uL   RBC 4.13 3.87 - 5.11 MIL/uL   Hemoglobin 7.3 (L) 12.0 - 15.0 g/dL   HCT 25.8 (L) 36.0 - 46.0 %   MCV 62.5 (L) 78.0 - 100.0 fL   MCH 17.7 (L) 26.0 - 34.0 pg   MCHC 28.3 (L) 30.0 - 36.0 g/dL   RDW 20.0 (H) 11.5 - 15.5 %   Platelets 313 150 - 400 K/uL  Type and screen     Status: None   Collection Time: 05/03/14  4:15 PM  Result Value Ref Range   ABO/RH(D) A POS    Antibody Screen NEG    Sample Expiration 05/06/2014    Unit Number [id number]    Blood Component Type RBC LR PHER2    Unit division 00    Status of Unit ISSUED,FINAL    Transfusion Status OK TO TRANSFUSE    Crossmatch Result Compatible    Unit Number [id number]    Blood Component Type RED CELLS,LR    Unit division 00    Status of Unit ISSUED,FINAL    Transfusion Status OK TO TRANSFUSE    Crossmatch Result Compatible   Fibrinogen (coagulopathy lab panel)     Status: None   Collection Time: 05/03/14  4:15 PM  Result Value Ref Range   Fibrinogen 408 204 - 475 mg/dL  Protime-INR (coagulopathy lab panel)     Status: Abnormal   Collection Time: 05/03/14  4:15 PM  Result Value Ref Range   Prothrombin Time 20.7 (H) 11.6 - 15.2 seconds   INR 1.76 (H) 0.00 - 1.49  APTT (coagulopathy lab panel)     Status: None   Collection Time: 05/03/14  4:15 PM  Result Value Ref Range   aPTT 34 24 - 37 seconds  hCG, serum, qualitative     Status: Abnormal   Collection Time: 05/03/14  4:15 PM  Result Value Ref Range   Preg, Serum POSITIVE (A) NEGATIVE    Comment:        THE SENSITIVITY OF THIS METHODOLOGY IS >10 mIU/mL.   hCG, quantitative, pregnancy     Status: None   Collection Time: 05/03/14  4:15 PM  Result Value Ref Range   hCG, Beta Chain, Quant, S <1 <5 mIU/mL    Comment:          GEST. AGE      CONC.  (mIU/mL)   <=1 WEEK        5 - 50     2 WEEKS       50 - 500     3 WEEKS       100 - 10,000     4 WEEKS     1,000 - 30,000     5 WEEKS     3,500 - 115,000   6-8 WEEKS     12,000 - 270,000    12 WEEKS     15,000 - 220,000  FEMALE AND NON-PREGNANT FEMALE:     LESS THAN 5 mIU/mL REPEATED TO VERIFY   Prepare RBC     Status: None   Collection Time: 05/03/14  9:36 PM  Result Value Ref Range   Order Confirmation ORDER PROCESSED BY BLOOD BANK   CBC     Status: Abnormal   Collection Time: 05/04/14 11:25 AM  Result Value Ref Range   WBC 8.3 4.0 - 10.5 K/uL   RBC 3.78 (L) 3.87 - 5.11 MIL/uL   Hemoglobin 7.6 (L) 12.0 - 15.0 g/dL   HCT 25.6 (L) 36.0 - 46.0 %   MCV 67.7 (L) 78.0 - 100.0 fL    Comment: REPEATED TO VERIFY DELTA CHECK NOTED POST TRANSFUSION SPECIMEN    MCH 20.1 (L) 26.0 - 34.0 pg   MCHC 29.7 (L) 30.0 - 36.0 g/dL   RDW 22.2 (H) 11.5 - 15.5 %   Platelets 242 150 - 400 K/uL    Comment: SPECIMEN CHECKED FOR CLOTS REPEATED TO VERIFY     Treatments: Blood transfusion and megace  Discharge Exam: Blood pressure 99/57, pulse 88, temperature 98.4 F (36.9 C), temperature source Oral, resp. rate 18, height 5\' 7"  (1.702 m), weight 80.74 kg (178 lb), last menstrual period 04/29/2014, SpO2 100 %.   Disposition: 01-Home or Self Care  Discharge Instructions    Call MD for:  temperature >100.4    Complete by:  As directed   Or increase in bleeding like previously     Diet - low sodium heart healthy    Complete by:  As directed      Discharge instructions    Complete by:  As directed   No driving on narcotics, no  sexual activity for 2 weeks.     Increase activity slowly    Complete by:  As directed      May shower / Bathe    Complete by:  As directed   Shower, no bath for 2 weeks.     Sexual Activity Restrictions    Complete by:  As directed   No sexual activity for 2 weeks.            Medication List    TAKE these medications        acetaminophen 500 MG tablet  Commonly known as:  TYLENOL  Take 500 mg by mouth every 6 (six) hours as needed for mild pain.     ferrous sulfate 325 (65 FE) MG EC tablet  Take 1 tablet (325 mg total) by mouth 2 (two) times daily.     HYDROcodone-acetaminophen 5-325 MG per tablet  Commonly known as:  NORCO/VICODIN  Take 2 tablets by mouth every 4 (four) hours as needed for severe pain (pain >7).     megestrol 40 MG tablet  Commonly known as:  MEGACE  Take 1 tablet (40 mg total) by mouth 3 (three) times daily. For two more days after today, then one pill BID for 3 days and then one pill per day until pt gets Mirena inserted in office.     [place] vaginally.     rivaroxaban 20 MG Tabs tablet  Commonly known as:  XARELTO  Take 1 tablet (20 mg total) by mouth daily with supper.         Signed: Zavier Canela A 05/12/2014, 8:53 AM

## 2014-08-02 ENCOUNTER — Ambulatory Visit (INDEPENDENT_AMBULATORY_CARE_PROVIDER_SITE_OTHER): Payer: BLUE CROSS/BLUE SHIELD | Admitting: Family Medicine

## 2014-08-02 ENCOUNTER — Encounter: Payer: Self-pay | Admitting: Family Medicine

## 2014-08-02 VITALS — BP 112/70 | Wt 188.0 lb

## 2014-08-02 DIAGNOSIS — S9031XA Contusion of right foot, initial encounter: Secondary | ICD-10-CM | POA: Diagnosis not present

## 2014-08-02 NOTE — Progress Notes (Signed)
   Subjective:    Patient ID: Peggy [name], female    DOB: 23-Nov-1975, 39 y.o.   MRN: [id number]  HPI She complains of swelling, discoloration and slight discomfort to the dorsal aspect of her right foot. She has had 2 things fall on it and a door close on her foot. He has concerns about possible DVT.   Review of Systems     Objective:   Physical Exam Slight discoloration and minimal swelling  is noted over the dorsum of the foot laterally. Good motion of the ankle. No bony tenderness.       Assessment & Plan:  Foot contusion, right, initial encounter I reassured her that this is a contusion that she is not at risk for DVT. Recommend supportive care.

## 2014-08-09 ENCOUNTER — Encounter: Payer: Self-pay | Admitting: Sports Medicine

## 2014-08-09 ENCOUNTER — Ambulatory Visit (INDEPENDENT_AMBULATORY_CARE_PROVIDER_SITE_OTHER): Payer: BLUE CROSS/BLUE SHIELD

## 2014-08-09 ENCOUNTER — Ambulatory Visit (INDEPENDENT_AMBULATORY_CARE_PROVIDER_SITE_OTHER): Payer: BLUE CROSS/BLUE SHIELD | Admitting: Sports Medicine

## 2014-08-09 VITALS — BP 105/75 | HR 107 | Ht 67.0 in | Wt 190.0 lb

## 2014-08-09 DIAGNOSIS — I82401 Acute embolism and thrombosis of unspecified deep veins of right lower extremity: Secondary | ICD-10-CM | POA: Diagnosis not present

## 2014-08-09 DIAGNOSIS — M546 Pain in thoracic spine: Secondary | ICD-10-CM

## 2014-08-09 DIAGNOSIS — M545 Low back pain, unspecified: Secondary | ICD-10-CM

## 2014-08-09 DIAGNOSIS — G43909 Migraine, unspecified, not intractable, without status migrainosus: Secondary | ICD-10-CM | POA: Diagnosis not present

## 2014-08-09 DIAGNOSIS — R51 Headache: Secondary | ICD-10-CM

## 2014-08-09 DIAGNOSIS — M542 Cervicalgia: Secondary | ICD-10-CM

## 2014-08-09 DIAGNOSIS — R519 Headache, unspecified: Secondary | ICD-10-CM | POA: Insufficient documentation

## 2014-08-09 DIAGNOSIS — G43109 Migraine with aura, not intractable, without status migrainosus: Secondary | ICD-10-CM

## 2014-08-09 LAB — POCT URINALYSIS DIPSTICK
Bilirubin, UA: NEGATIVE
Blood, UA: NEGATIVE
Glucose, UA: NEGATIVE
Ketones, UA: NEGATIVE
Leukocytes, UA: NEGATIVE
Nitrite, UA: NEGATIVE
Spec Grav, UA: >=1.03
Urobilinogen, UA: 0.2
pH, UA: 5.5

## 2014-08-09 MED ORDER — CYCLOBENZAPRINE HCL 10 MG PO TABS: ORAL_TABLET | ORAL | Status: DC

## 2014-08-09 MED ORDER — TOPIRAMATE 50 MG PO TABS: ORAL_TABLET | ORAL | Status: DC

## 2014-08-09 NOTE — Assessment & Plan Note (Addendum)
Starting low-dose Topamax. Symptoms do present with right arm paresthesias for 20 minutes and then a dull headache. Further management of this can be through PCP

## 2014-08-09 NOTE — Assessment & Plan Note (Addendum)
Continue tramadol, adding Flexeril at bedtime, x-rays, formal physical therapy. Return in one month, MRI for interventional if no better. We are unable to use NSAIDs due to current Xarelto treatment. There is some urinary symptoms so we are going to check a UA, UA is negative. Return in one month. She would also be a good candidate for custom orthotics

## 2014-08-09 NOTE — Progress Notes (Signed)
   Subjective:    I'm seeing this patient as a consultation for:  Dr. Redmond School  CC: Back pain  HPI: This is a pleasant 39 year old female, for sometime now she's had pain that she localizes along the midline of her lumbar spine, moderate, persistent without radiation, worse with sitting, flexion, Valsalva. She has tramadol already, pain is worse at bedtime. She has not had physical therapy, or other medications. She does use Xarelto for recent right lower extremity DVT, she also recently had a unit tract infection but has not yet started antibiotics. No bowel or bladder dysfunction, saddle numbness, constitutional symptoms.  On further questioning she does have some right-sided paresthesias, upper extremity, when asked about associated symptoms, the paresthesias occur approximately once per month, they last 20 minutes, and are followed by a dull, throbbing headache.  Past medical history, Surgical history, Family history not pertinant except as noted below, Social history, Allergies, and medications have been entered into the medical record, reviewed, and no changes needed.   Review of Systems: No headache, visual changes, nausea, vomiting, diarrhea, constipation, dizziness, abdominal pain, skin rash, fevers, chills, night sweats, weight loss, swollen lymph nodes, body aches, joint swelling, muscle aches, chest pain, shortness of breath, mood changes, visual or auditory hallucinations.   Objective:   General: Well Developed, well nourished, and in no acute distress.  Neuro/Psych: Alert and oriented x3, extra-ocular muscles intact, able to move all 4 extremities, sensation grossly intact. Cranial nerves II through XII are intact, motor, sensory, and coordinative functions are intact. Skin: Warm and dry, no rashes noted.  Respiratory: Not using accessory muscles, speaking in full sentences, trachea midline.  Cardiovascular: Pulses palpable, no extremity edema. Abdomen: Does not appear  distended. Back Exam:  Inspection: Unremarkable  Motion: Flexion 45 deg, Extension 45 deg, Side Bending to 45 deg bilaterally,  Rotation to 45 deg bilaterally  SLR laying: Negative  XSLR laying: Negative  Palpable tenderness: None. FABER: negative. Sensory change: Gross sensation intact to all lumbar and sacral dermatomes.  Reflexes: 2+ at both patellar tendons, 2+ at achilles tendons, Babinski's downgoing.  Strength at foot  Plantar-flexion: 5/5 Dorsi-flexion: 5/5 Eversion: 5/5 Inversion: 5/5  Leg strength  Quad: 5/5 Hamstring: 5/5 Hip flexor: 5/5 Hip abductors: 5/5  Gait unremarkable.  Impression and Recommendations:   This case required medical decision making of moderate complexity.

## 2014-08-09 NOTE — Assessment & Plan Note (Signed)
On Xarelto until October.

## 2014-08-14 ENCOUNTER — Emergency Department (HOSPITAL_COMMUNITY)
Admission: EM | Admit: 2014-08-14 | Discharge: 2014-08-14 | Disposition: A | Discharge status: Home / Self Care | Payer: BLUE CROSS/BLUE SHIELD | Attending: Emergency Medicine | Admitting: Emergency Medicine

## 2014-08-14 ENCOUNTER — Emergency Department (HOSPITAL_COMMUNITY): Payer: BLUE CROSS/BLUE SHIELD

## 2014-08-14 ENCOUNTER — Encounter (HOSPITAL_COMMUNITY): Payer: Self-pay

## 2014-08-14 DIAGNOSIS — Z79818 Long term (current) use of other agents affecting estrogen receptors and estrogen levels: Secondary | ICD-10-CM | POA: Diagnosis not present

## 2014-08-14 DIAGNOSIS — Z86718 Personal history of other venous thrombosis and embolism: Secondary | ICD-10-CM | POA: Insufficient documentation

## 2014-08-14 DIAGNOSIS — M545 Low back pain, unspecified: Secondary | ICD-10-CM

## 2014-08-14 DIAGNOSIS — Z86018 Personal history of other benign neoplasm: Secondary | ICD-10-CM | POA: Diagnosis not present

## 2014-08-14 DIAGNOSIS — Z3202 Encounter for pregnancy test, result negative: Secondary | ICD-10-CM | POA: Diagnosis not present

## 2014-08-14 DIAGNOSIS — N83519 Torsion of ovary and ovarian pedicle, unspecified side: Secondary | ICD-10-CM

## 2014-08-14 DIAGNOSIS — Z79899 Other long term (current) drug therapy: Secondary | ICD-10-CM | POA: Insufficient documentation

## 2014-08-14 DIAGNOSIS — Z7901 Long term (current) use of anticoagulants: Secondary | ICD-10-CM | POA: Insufficient documentation

## 2014-08-14 DIAGNOSIS — Z9889 Other specified postprocedural states: Secondary | ICD-10-CM | POA: Diagnosis not present

## 2014-08-14 LAB — URINALYSIS, ROUTINE W REFLEX MICROSCOPIC
Bilirubin Urine: NEGATIVE
Glucose, UA: NEGATIVE mg/dL
Ketones, ur: NEGATIVE mg/dL
NITRITE: NEGATIVE
PROTEIN: NEGATIVE mg/dL
Specific Gravity, Urine: 1.022 (ref 1.005–1.030)
Urobilinogen, UA: 0.2 mg/dL (ref 0.0–1.0)
pH: 5.5 (ref 5.0–8.0)

## 2014-08-14 LAB — URINE MICROSCOPIC-ADD ON

## 2014-08-14 LAB — PREGNANCY, URINE: Preg Test, Ur: NEGATIVE

## 2014-08-14 MED ORDER — TAMSULOSIN HCL 0.4 MG PO CAPS: 0.4000 mg | ORAL_CAPSULE | Freq: Two times a day (BID) | ORAL | Status: DC

## 2014-08-14 MED ORDER — IBUPROFEN 600 MG PO TABS: 600.0000 mg | ORAL_TABLET | Freq: Four times a day (QID) | ORAL | Status: DC | PRN

## 2014-08-14 MED ORDER — OXYCODONE-ACETAMINOPHEN 5-325 MG PO TABS: 1.0000 | ORAL_TABLET | ORAL | Status: DC | PRN

## 2014-08-14 MED ORDER — ONDANSETRON HCL 4 MG PO TABS: 4.0000 mg | ORAL_TABLET | Freq: Four times a day (QID) | ORAL | Status: DC

## 2014-08-14 NOTE — ED Notes (Signed)
She c/o non-radiating, non-traumatic left-sided low back pain.  She cites recently "walked a lot while I was at [employer]".  She arrives drowsy and in no distress; having received 150mg  Fentanyl IV en route to hospital.

## 2014-08-14 NOTE — ED Notes (Signed)
Bed: WA14 Expected date:  Expected time:  Means of arrival:  Comments: EMS-back pain 

## 2014-08-14 NOTE — Discharge Instructions (Signed)
You were evaluated in the ED today for your low back pain. There does not appear to be an emergent cause her symptoms at this time. You likely have a kidney stone, you will be treated for this problem. Please follow-up with urology for further evaluation and management of your symptoms. Please take her medications as directed. Do not take the pain medicine before driving or operating machinery. Return to ED for new or worsening symptoms.

## 2014-08-14 NOTE — ED Provider Notes (Signed)
CSN: [id number]     Arrival date & time 08/14/14  1008 History   First MD Initiated Contact with Patient 08/14/14 1018     Chief Complaint  Patient presents with  . Back Pain     (Consider location/radiation/quality/duration/timing/severity/associated sxs/prior Treatment) HPI Peggy [name] is a 39 y.o. female history of DVT currently on Xarelto therapy, fibroid uterus, comes in for evaluation of left lower back pain. Patient states she was doing significant walking around [place] yesterday picking up her daughter from college, woke up this morning with mild "muscle soreness" in her lower left back. She reports his pain gradually increased throughout the day. She reports one episode of nausea and vomiting this morning secondary to the pain. She tried taking a hot shower but this did not relieve the discomfort. She denies any fevers, chills, numbness or weakness, chronic steroid use, IV drug use, loss of bowel or bladder function. She received 150 mg of IV fentanyl from EMS in route here patient reports 0 pain at this time and "feels very drowsy."  Past Medical History  Diagnosis Date  . Fibromyalgia   . Infertility management   . Fibroid uterus   . DVT (deep venous thrombosis)    Past Surgical History  Procedure Laterality Date  . Cesarean section    . Tonsillectomy    . Foot surgery      right foot  . Cardiac catheterization  approx 8 years ago    for chest pain,   . Cesarean section N/A 02/29/2012    Procedure: Repeat CESAREAN SECTION of baby boy at [place] 8/8;  Surgeon: Daria Pastures, MD;  Location: [facility];  Service: Obstetrics;  Laterality: N/A;   Family History  Problem Relation Age of Onset  . Diabetes Maternal Grandmother   . Heart disease Maternal Grandmother   . Heart disease Maternal Grandfather    History  Substance Use Topics  . Smoking status: Never Smoker   . Smokeless tobacco: Never Used  . Alcohol Use: Yes     Comment: 1-2 drinks per month.    OB History    Gravida Para Term Preterm AB TAB SAB Ectopic Multiple Living   2 2 1 1      3      Review of Systems A 10 point review of systems was completed and was negative except for pertinent positives and negatives as mentioned in the history of present illness     Allergies  Review of patient's allergies indicates no known allergies.  Home Medications   Prior to Admission medications   Medication Sig Start Date End Date Taking? Authorizing Provider  acetaminophen (TYLENOL) 500 MG tablet Take 500 mg by mouth every 6 (six) hours as needed for mild pain.    Historical Provider, MD  cyclobenzaprine (FLEXERIL) 10 MG tablet One half tab PO qHS, then increase gradually to one tab TID. 08/09/14   Silverio Decamp, MD  ferrous sulfate 325 (65 FE) MG EC tablet Take 1 tablet (325 mg total) by mouth 2 (two) times daily. 09/07/13   Denita Lung, MD  ibuprofen (ADVIL,MOTRIN) 600 MG tablet Take 1 tablet (600 mg total) by mouth every 6 (six) hours as needed. 08/14/14   Comer Locket, PA-C  megestrol (MEGACE) 40 MG tablet Take 1 tablet (40 mg total) by mouth 3 (three) times daily. For two more days after today, then one pill BID for 3 days and then one pill per day until pt gets Mirena inserted  in office. 05/04/14   Bobbye Charleston, MD  norethindrone-ethinyl estradiol-iron (ESTROSTEP FE,TILIA FE,TRI-LEGEST FE) 1-20/1-30/1-35 MG-MCG tablet Take 1 tablet by mouth daily.    Historical Provider, MD  ondansetron (ZOFRAN) 4 MG tablet Take 1 tablet (4 mg total) by mouth every 6 (six) hours. 08/14/14   Comer Locket, PA-C  oxyCODONE-acetaminophen (PERCOCET/ROXICET) 5-325 MG per tablet Take 1-2 tablets by mouth every 4 (four) hours as needed for severe pain. 08/14/14   Comer Locket, PA-C  rivaroxaban (XARELTO) 20 MG TABS tablet Take 1 tablet (20 mg total) by mouth daily with supper. 04/19/14   Denita Lung, MD  tamsulosin (FLOMAX) 0.4 MG CAPS capsule Take 1 capsule (0.4 mg total) by mouth 2  (two) times daily. 08/14/14   Comer Locket, PA-C  topiramate (TOPAMAX) 50 MG tablet One half tab by mouth daily for a week, then one tab by mouth daily. 08/09/14   Silverio Decamp, MD  traMADol (ULTRAM) 50 MG tablet Take by mouth every 6 (six) hours as needed.    Historical Provider, MD   BP 105/68 mmHg  Pulse 81  Temp(Src) 98.2 F (36.8 C) (Oral)  Resp 16  Ht 5\' 7"  (1.702 m)  Wt 189 lb (85.73 kg)  BMI 29.59 kg/m2  SpO2 100% Physical Exam  Constitutional: She is oriented to person, place, and time. She appears well-developed and well-nourished.  HENT:  Head: Normocephalic and atraumatic.  Mouth/Throat: Oropharynx is clear and moist.  Eyes: Conjunctivae are normal. Pupils are equal, round, and reactive to light. Right eye exhibits no discharge. Left eye exhibits no discharge. No scleral icterus.  Neck: Neck supple.  Cardiovascular: Normal rate, regular rhythm and normal heart sounds.   Pulmonary/Chest: Effort normal and breath sounds normal. No respiratory distress. She has no wheezes. She has no rales.  Abdominal: Soft. There is no tenderness.  Mild discomfort with deep palpation in lower left quadrant.  Musculoskeletal: She exhibits no tenderness.  Diffuse tenderness throughout the paraspinal left lumbar muscles with no overt midline bony tenderness. No lesions or deformities noted. No bony step-offs or crepitus.  Neurological: She is alert and oriented to person, place, and time.  Cranial Nerves II-XII grossly intact  Skin: Skin is warm and dry. No rash noted.  Psychiatric: She has a normal mood and affect.  Nursing note and vitals reviewed.   ED Course  Procedures (including critical care time) Labs Review Labs Reviewed  URINALYSIS, ROUTINE W REFLEX MICROSCOPIC (NOT AT [facility]) - Abnormal; Notable for the following:    APPearance CLOUDY (*)    Hgb urine dipstick MODERATE (*)    Leukocytes, UA TRACE (*)    All other components within normal limits  URINE  MICROSCOPIC-ADD ON - Abnormal; Notable for the following:    Squamous Epithelial / LPF FEW (*)    Crystals CA OXALATE CRYSTALS (*)    All other components within normal limits  PREGNANCY, URINE    Imaging Review US Transvaginal Non-ob  08/14/2014   CLINICAL DATA:  Left-sided pelvic pain for 2 days.  EXAM: TRANSABDOMINAL AND TRANSVAGINAL ULTRASOUND OF PELVIS  DOPPLER ULTRASOUND OF OVARIES  TECHNIQUE: Both transabdominal and transvaginal ultrasound examinations of the pelvis were performed. Transabdominal technique was performed for global imaging of the pelvis including uterus, ovaries, adnexal regions, and pelvic cul-de-sac.  It was necessary to proceed with endovaginal exam following the transabdominal exam to visualize the bilateral ovaries. Color and duplex Doppler ultrasound was utilized to evaluate blood flow to the ovaries.  COMPARISON:  Pelvic  ultrasound -11/24/2012  FINDINGS: Uterus  Measurements: Grossly unchanged in appearance of the enlarged and slightly heterogeneous appearing anteverted uterus which measures approximately 13.6 x 7.8 x 11.2 cm in diameter. Note is made of an ill-defined approximately 3.3 x 2.3 x 3.5 cm mixed echogenic though slightly hypoechoic structure within the posterior aspect of the uterine fundus which may represent normal slightly heterogeneous appearing uterine tissue versus a poorly defined uterine fibroid.  Endometrium  Not well visualized due to the heterogeneous appearance of the uterus  Right ovary  Normal in size and appearance, measuring approximately 2.6 x 2.0 x 1.8 cm. Normal arterial and venous waveforms are demonstrated within the right ovary. No discrete right ovarian or adnexal lesion.  Left ovary  Only seen with transabdominal imaging. The left ovary is normal in size and appearance measuring approximately 2.8 x 2.0 x 2.7 cm. Normal arterial and venous waveforms are demonstrated within the left ovary. No discrete left ovarian or adnexal lesion.  Other  findings  No free fluid.  IMPRESSION: 1. No definite explanation for patient's left pelvic pain. Specifically, while only seen on transabdominal imaging, the left ovary is normal in size and appearance with normal low resistance waveforms. 2. Nonspecific mild enlargement and heterogeneous appearance of the uterus, similar to the 11/2012 examination. 3. Potential 3.5 cm fibroid within the posterior aspect of the uterine fundus.   Electronically Signed   By: Sandi Mariscal M.D.   On: 08/14/2014 12:27   US Pelvis Complete  08/14/2014   CLINICAL DATA:  Left-sided pelvic pain for 2 days.  EXAM: TRANSABDOMINAL AND TRANSVAGINAL ULTRASOUND OF PELVIS  DOPPLER ULTRASOUND OF OVARIES  TECHNIQUE: Both transabdominal and transvaginal ultrasound examinations of the pelvis were performed. Transabdominal technique was performed for global imaging of the pelvis including uterus, ovaries, adnexal regions, and pelvic cul-de-sac.  It was necessary to proceed with endovaginal exam following the transabdominal exam to visualize the bilateral ovaries. Color and duplex Doppler ultrasound was utilized to evaluate blood flow to the ovaries.  COMPARISON:  Pelvic ultrasound -11/24/2012  FINDINGS: Uterus  Measurements: Grossly unchanged in appearance of the enlarged and slightly heterogeneous appearing anteverted uterus which measures approximately 13.6 x 7.8 x 11.2 cm in diameter. Note is made of an ill-defined approximately 3.3 x 2.3 x 3.5 cm mixed echogenic though slightly hypoechoic structure within the posterior aspect of the uterine fundus which may represent normal slightly heterogeneous appearing uterine tissue versus a poorly defined uterine fibroid.  Endometrium  Not well visualized due to the heterogeneous appearance of the uterus  Right ovary  Normal in size and appearance, measuring approximately 2.6 x 2.0 x 1.8 cm. Normal arterial and venous waveforms are demonstrated within the right ovary. No discrete right ovarian or adnexal  lesion.  Left ovary  Only seen with transabdominal imaging. The left ovary is normal in size and appearance measuring approximately 2.8 x 2.0 x 2.7 cm. Normal arterial and venous waveforms are demonstrated within the left ovary. No discrete left ovarian or adnexal lesion.  Other findings  No free fluid.  IMPRESSION: 1. No definite explanation for patient's left pelvic pain. Specifically, while only seen on transabdominal imaging, the left ovary is normal in size and appearance with normal low resistance waveforms. 2. Nonspecific mild enlargement and heterogeneous appearance of the uterus, similar to the 11/2012 examination. 3. Potential 3.5 cm fibroid within the posterior aspect of the uterine fundus.   Electronically Signed   By: Sandi Mariscal M.D.   On: 08/14/2014 12:27  [place] Art/ven Flow Abd Pelv Doppler  08/14/2014   CLINICAL DATA:  Left-sided pelvic pain for 2 days.  EXAM: TRANSABDOMINAL AND TRANSVAGINAL ULTRASOUND OF PELVIS  DOPPLER ULTRASOUND OF OVARIES  TECHNIQUE: Both transabdominal and transvaginal ultrasound examinations of the pelvis were performed. Transabdominal technique was performed for global imaging of the pelvis including uterus, ovaries, adnexal regions, and pelvic cul-de-sac.  It was necessary to proceed with endovaginal exam following the transabdominal exam to visualize the bilateral ovaries. Color and duplex Doppler ultrasound was utilized to evaluate blood flow to the ovaries.  COMPARISON:  Pelvic ultrasound -11/24/2012  FINDINGS: Uterus  Measurements: Grossly unchanged in appearance of the enlarged and slightly heterogeneous appearing anteverted uterus which measures approximately 13.6 x 7.8 x 11.2 cm in diameter. Note is made of an ill-defined approximately 3.3 x 2.3 x 3.5 cm mixed echogenic though slightly hypoechoic structure within the posterior aspect of the uterine fundus which may represent normal slightly heterogeneous appearing uterine tissue versus a poorly defined uterine  fibroid.  Endometrium  Not well visualized due to the heterogeneous appearance of the uterus  Right ovary  Normal in size and appearance, measuring approximately 2.6 x 2.0 x 1.8 cm. Normal arterial and venous waveforms are demonstrated within the right ovary. No discrete right ovarian or adnexal lesion.  Left ovary  Only seen with transabdominal imaging. The left ovary is normal in size and appearance measuring approximately 2.8 x 2.0 x 2.7 cm. Normal arterial and venous waveforms are demonstrated within the left ovary. No discrete left ovarian or adnexal lesion.  Other findings  No free fluid.  IMPRESSION: 1. No definite explanation for patient's left pelvic pain. Specifically, while only seen on transabdominal imaging, the left ovary is normal in size and appearance with normal low resistance waveforms. 2. Nonspecific mild enlargement and heterogeneous appearance of the uterus, similar to the 11/2012 examination. 3. Potential 3.5 cm fibroid within the posterior aspect of the uterine fundus.   Electronically Signed   By: Sandi Mariscal M.D.   On: 08/14/2014 12:27     EKG Interpretation None     Meds given in ED:  Medications - No data to display  New Prescriptions   IBUPROFEN (ADVIL,MOTRIN) 600 MG TABLET    Take 1 tablet (600 mg total) by mouth every 6 (six) hours as needed.   ONDANSETRON (ZOFRAN) 4 MG TABLET    Take 1 tablet (4 mg total) by mouth every 6 (six) hours.   OXYCODONE-ACETAMINOPHEN (PERCOCET/ROXICET) 5-325 MG PER TABLET    Take 1-2 tablets by mouth every 4 (four) hours as needed for severe pain.   TAMSULOSIN (FLOMAX) 0.4 MG CAPS CAPSULE    Take 1 capsule (0.4 mg total) by mouth 2 (two) times daily.   Filed Vitals:   08/14/14 1010 08/14/14 1231  BP: 119/72 105/68  Pulse: 87 81  Temp: 98.7 F (37.1 C) 98.2 F (36.8 C)  TempSrc: Oral Oral  Resp: 16 16  Height: 5\' 7"  (1.702 m)   Weight: 189 lb (85.73 kg)   SpO2: 96% 100%    MDM  Vitals stable - WNL -afebrile Pt resting  comfortably in ED. PE--mild tenderness to palpation in left lower quadrant, tender to palpation in the left paraspinal lumbar muscles, physical exam otherwise noncontributory. Labwork--hemoglobin, RBCs and calcium oxalate crystals on urinalysis. Imaging--a transvaginal ultrasound negative for acute pathology, no evidence of torsion.  DDX--patient with likely nephrolithiasis, we'll treat for stone as well as musculoskeletal injury. Given follow-up with urology. No evidence of acute  intra-abdominal emergency. No evidence of urosepsis, septic stone.  I discussed all relevant lab findings and imaging results with pt and they verbalized understanding. Discussed f/u with PCP within 48 hrs and return precautions, pt very amenable to plan.  Final diagnoses:  Left-sided low back pain without sciatica        Comer Locket, PA-C 08/14/14 1337  Charlesetta Shanks, MD 08/22/14 1500

## 2014-08-14 NOTE — ED Notes (Signed)
She has slowly and capably ambulated to b.r. And back and remains in no distress and states she is "relatively comfortable" and requests no meds at present.  I encourage her to let us know if she ever needs anything.  As I write this the ultrasonographer has just begun her u/s.

## 2014-08-25 ENCOUNTER — Encounter: Payer: Self-pay | Admitting: Rehabilitative and Restorative Service Providers"

## 2014-08-25 ENCOUNTER — Ambulatory Visit (INDEPENDENT_AMBULATORY_CARE_PROVIDER_SITE_OTHER): Payer: BLUE CROSS/BLUE SHIELD | Admitting: Rehabilitative and Restorative Service Providers"

## 2014-08-25 DIAGNOSIS — M545 Low back pain, unspecified: Secondary | ICD-10-CM

## 2014-08-25 NOTE — Therapy (Addendum)
[place], [place], [address] Phone: [phone]   Fax:  [phone]  Physical Therapy Evaluation  Patient Details  Name: Peggy [name] MRN: [id number] Date of Birth: 1975/09/05 Referring Provider:  Silverio Decamp,*  Encounter Date: 08/25/2014      PT End of Session - 08/25/14 1626    Visit Number 1   Number of Visits 1   PT Start Time 0224   PT Stop Time 0329   PT Time Calculation (min) 65 min   Activity Tolerance Patient tolerated treatment well      Past Medical History  Diagnosis Date  . Fibromyalgia   . Infertility management   . Fibroid uterus   . DVT (deep venous thrombosis)     Past Surgical History  Procedure Laterality Date  . Cesarean section    . Tonsillectomy    . Foot surgery      right foot  . Cardiac catheterization  approx 8 years ago    for chest pain,   . Cesarean section N/A 02/29/2012    Procedure: Repeat CESAREAN SECTION of baby boy at [place] 8/8;  Surgeon: Daria Pastures, MD;  Location: [facility];  Service: Obstetrics;  Laterality: N/A;    There were no vitals filed for this visit.  Visit Diagnosis:  Midline low back pain without sciatica - Plan: PT plan of care cert/re-cert      Subjective Assessment - 08/25/14 1456    Subjective Patient reports onset of Lt LBP in the past few months but has had gradual increase in LBP over the past 3 years. Back pain daily and limits activity level.    Pertinent History On bed rest for 3 months with pregnancy 12/13- 03/14 before the birth of her son. C-section 03/14 and 2009. six months after first child duagnosed with fibromyalgia and tremors and r/o MS due to lesions on her brain. History of atypical migraines including tremor/ heaviness in back of head/ nauseousness which resloves without medicatioin in 15-20 min. Episodes occur several times/week now. Bone removed from Rt great toe(metarsal) ~25 years ago has  episodic pain in foot.  Coccyx fracture at 39 yr old.   How long can you sit comfortably? 30 min    How long can you stand comfortably? 15 min   How long can you walk comfortably? 20-30 min    Diagnostic tests xrays   Patient Stated Goals decreased pain in LB replace HNP   Currently in Pain? Yes   Pain Score 3    Pain Location Back   Pain Orientation Left;Lower   Pain Descriptors / Indicators Dull  sharp pain 8-9/10 when it occurs but does last long resolves with sitting/heating pad/hot shower    Pain Type Chronic pain   Pain Radiating Towards up along spine into Lt shoulder blade area   Aggravating Factors  sitting, standing, walking, lifting, bending   Pain Relieving Factors heat; hot shower            [facility] PT Assessment - 08/25/14 0001    Assessment   Medical Diagnosis low back pain   Onset Date/Surgical Date 06/30/14   Hand Dominance Right   Next MD Visit 08/26/14   Balance Screen   Has the patient fallen in the past 6 months No   Has the patient had a decrease in activity level because of a fear of falling?  No   Is the patient reluctant to leave their home  because of a fear of falling?  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    Observation/Other Assessments   Focus on Therapeutic Outcomes (FOTO)  60%    Sensation   Additional Comments tingling in hands and feet episodically    Posture/Postural Control   Posture Comments head forward; shoulders rounded and elevated; head of the humerus anterior in orientation; licreased lumbar lordosis; extended at hips; hyperextended at knees   AROM   Lumbar Flexion finger tips 8 in from floor   Lumbar Extension 10%   Lumbar - Right Side Bend finger tips 2 in above lat knee  crease   Lumbar - Left Side Bend finger tips 3 in above lat knee crease painful   Lumbar - Right Rotation 20% painful   Lumbar - Left Rotation 20% painful    Strength   Overall Strength Comments grossly WFL's bilat LE's - poor core strength/poor contraction transverse abdominals    Flexibility   Hamstrings Rt 54 degrees; Lt 59 degrees   Quadriceps Rt heel 8 in from buttock; Lt 8.5    Palpation   Spinal mobility pain with spring testing lumbar spine   Palpation comment pain with palpation Lt > Rt paraspinals, piriformis and hip abductors; lats; thoracic and cervical paraspinals; shoulders   Ambulation/Gait   Gait Comments antalgic gait      FOTO 60% limitation              [facility] Adult PT Treatment/Exercise - 08/25/14 0001    Self-Care   Self-Care --  discussed possible trial of TENS unit for pain management   Exercises   Exercises --  3 part core difficulty with contraction of 3 muscle groups    Lumbar Exercises: Stretches   Standing Extension 3 reps  2-3 sec hold    Press Ups --  10 reps 2-3 sec pause    Moist Heat Therapy   Number Minutes Moist Heat 15 Minutes   Moist Heat Location Lumbar Spine   Electrical Stimulation   Electrical Stimulation Location lumbar/Lt hip   Electrical Stimulation Action IFC   Electrical Stimulation Parameters to tolerance   Electrical Stimulation Goals Pain                PT Education - 08/25/14 1615    Education provided Yes   Education Details Petra Kuba of chronic pain; treatment options; exercise; positioning; TENS unit for home   Person(s) Educated Patient   Methods Explanation;Demonstration;Verbal cues;Handout   Comprehension Verbalized understanding;Returned demonstration;Verbal cues required                    Plan - 08/25/14 1627    Clinical Impression Statement Patient presents with chronic LB pain with significant flare up in the past 2 months. Peggy [name] has several additional medical problems. 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  She would like to go to PT in [place] instead of drive to [place] 2-3 times for week for therapy. We provided information for [facility] in [place] and patient will follow up.    Rehab Potential Good   PT Home Exercise Plan begin  spinal extension program as tolerated; trial of core stabilization   Consulted and Agree with Plan of Care Patient         Problem List Patient Active Problem List   Diagnosis Date Noted  . Bilateral low back pain without sciatica 08/09/2014  . Complicated migraine 01/77/9390  . Right leg DVT 08/09/2014  . Anemia due to blood loss, acute 05/03/2014  . Fibromyalgia 07/06/2010    Aasir Daigler Nilda Simmer ,PT, MPH  08/25/2014, 5:01 PM  [facility] [place], [place], [address] Phone: [phone]   Fax:  [phone]

## 2014-08-25 NOTE — Patient Instructions (Signed)
Abdominal Bracing With Pelvic Floor (Hook-Lying)   With neutral spine, tighten pelvic floor and abdominals, tighten muscles in low back at waist. Hold 10 sec. Repeat _10__ times.  Do _several__ times a day.   Trunk: Prone Extension (Press-Ups)   Lie on stomach on firm, flat surface. Relax bottom and legs. Raise chest in air with elbows straight. Keep hips flat on surface, sag stomach. Hold _1-2___ seconds. Repeat __10__ times. Do _3-4___ sessions per day. CAUTION: Movement should be gentle and slow.   Trunk Extension   Standing, place back of open hands on low back. Straighten spine then arch the back and move shoulders back. Repeat __2-5__ times per session. Do _several times per day   TENS unit for pain management - [employer]  7000 series or others as desired

## 2014-08-26 ENCOUNTER — Ambulatory Visit (INDEPENDENT_AMBULATORY_CARE_PROVIDER_SITE_OTHER): Payer: BLUE CROSS/BLUE SHIELD | Admitting: Sports Medicine

## 2014-08-26 ENCOUNTER — Encounter: Payer: Self-pay | Admitting: Sports Medicine

## 2014-08-26 VITALS — BP 129/90 | HR 102 | Ht 67.0 in | Wt 191.0 lb

## 2014-08-26 DIAGNOSIS — E669 Obesity, unspecified: Secondary | ICD-10-CM

## 2014-08-26 DIAGNOSIS — M545 Low back pain, unspecified: Secondary | ICD-10-CM

## 2014-08-26 MED ORDER — PHENTERMINE HCL 37.5 MG PO TABS
ORAL_TABLET | ORAL | Status: DC
Start: 2014-08-26 — End: 2014-09-23

## 2014-08-26 NOTE — Assessment & Plan Note (Signed)
Custom orthotics as above, continue PT.

## 2014-08-26 NOTE — Progress Notes (Signed)

## 2014-08-26 NOTE — Addendum Note (Signed)
Addended by: Silverio Decamp on: 08/26/2014 09:34 AM   Modules accepted: Orders

## 2014-08-26 NOTE — Assessment & Plan Note (Signed)
Starting phentermine, return monthly for weight checks and refills. 

## 2014-09-03 ENCOUNTER — Other Ambulatory Visit: Payer: Self-pay | Admitting: Sports Medicine

## 2014-09-06 ENCOUNTER — Ambulatory Visit (INDEPENDENT_AMBULATORY_CARE_PROVIDER_SITE_OTHER): Payer: BLUE CROSS/BLUE SHIELD

## 2014-09-06 ENCOUNTER — Ambulatory Visit (INDEPENDENT_AMBULATORY_CARE_PROVIDER_SITE_OTHER): Payer: BLUE CROSS/BLUE SHIELD | Admitting: Sports Medicine

## 2014-09-06 ENCOUNTER — Encounter: Payer: Self-pay | Admitting: Sports Medicine

## 2014-09-06 VITALS — BP 116/80 | HR 105 | Wt 186.0 lb

## 2014-09-06 DIAGNOSIS — M545 Low back pain, unspecified: Secondary | ICD-10-CM

## 2014-09-06 DIAGNOSIS — E669 Obesity, unspecified: Secondary | ICD-10-CM

## 2014-09-06 DIAGNOSIS — G43909 Migraine, unspecified, not intractable, without status migrainosus: Secondary | ICD-10-CM | POA: Diagnosis not present

## 2014-09-06 DIAGNOSIS — M25674 Stiffness of right foot, not elsewhere classified: Secondary | ICD-10-CM

## 2014-09-06 DIAGNOSIS — M2021 Hallux rigidus, right foot: Secondary | ICD-10-CM | POA: Insufficient documentation

## 2014-09-06 DIAGNOSIS — M25571 Pain in right ankle and joints of right foot: Secondary | ICD-10-CM

## 2014-09-06 DIAGNOSIS — G43109 Migraine with aura, not intractable, without status migrainosus: Secondary | ICD-10-CM

## 2014-09-06 MED ORDER — TOPIRAMATE 100 MG PO TABS: 100.0000 mg | ORAL_TABLET | Freq: Every day | ORAL | Status: DC

## 2014-09-06 NOTE — Progress Notes (Signed)
  Subjective:    CC: Follow-up  HPI: Low back pain: Improving significantly with physical therapy and weight loss  Obesity: Good weight loss after 10 days.  Migraines: Significantly improved with using only 50 mg of Topamax.  Left foot pain: Moderate, persistent, localized first MTP.  Past medical history, Surgical history, Family history not pertinant except as noted below, Social history, Allergies, and medications have been entered into the medical record, reviewed, and no changes needed.   Review of Systems: No fevers, chills, night sweats, weight loss, chest pain, or shortness of breath.   Objective:    General: Well Developed, well nourished, and in no acute distress.  Neuro: Alert and oriented x3, extra-ocular muscles intact, sensation grossly intact.  HEENT: Normocephalic, atraumatic, pupils equal round reactive to light, neck supple, no masses, no lymphadenopathy, thyroid nonpalpable.  Skin: Warm and dry, no rashes. Cardiac: Regular rate and rhythm, no murmurs rubs or gallops, no lower extremity edema.  Respiratory: Clear to auscultation bilaterally. Not using accessory muscles, speaking in full sentences. Left Foot: No visible erythema or swelling. Range of motion is full in all directions. Strength is 5/5 in all directions. No hallux valgus. No pes cavus or pes planus. No abnormal callus noted. No pain over the navicular prominence, or base of fifth metatarsal. No tenderness to palpation of the calcaneal insertion of plantar fascia. No pain at the Achilles insertion. No pain over the calcaneal bursa. No pain of the retrocalcaneal bursa. No tenderness to palpation over the tarsals, metatarsals, or phalanges. No hallux rigidus or limitus. Tender to palpation at the first MTP No pain with compression of the metatarsal heads. Neurovascularly intact distally.  Procedure: Real-time Ultrasound Guided Injection of left first MTP Device: GE Logiq E  Verbal informed  consent obtained.  Time-out conducted.  Noted no overlying erythema, induration, or other signs of local infection.  Skin prepped in a sterile fashion.  Local anesthesia: Topical Ethyl chloride.  With sterile technique and under real time ultrasound guidance:  1/2 mL kenalog 40, 1/2 mL lidocaine injected easily Completed without difficulty  Pain immediately resolved suggesting accurate placement of the medication.  Advised to call if fevers/chills, erythema, induration, drainage, or persistent bleeding.  Images permanently stored and available for review in the ultrasound unit.  Impression: Technically successful ultrasound guided injection.  Dancers pad placed in left orthotic.  Impression and Recommendations:

## 2014-09-06 NOTE — Assessment & Plan Note (Signed)
Good continued weight loss.  Return to see me in 2-1/2 weeks, and we will refill phentermine, that will be the start of the second month.

## 2014-09-06 NOTE — Assessment & Plan Note (Signed)
Injection as above. Dancers pad placed orthotic. X-rays. We will discuss this again at her weight check.

## 2014-09-06 NOTE — Assessment & Plan Note (Signed)
Fantastic response to 50 mg of Topamax, near complete resolution of symptoms but amenable to go up to 100 mg.

## 2014-09-06 NOTE — Assessment & Plan Note (Signed)
Fantastic response to physical therapy, orthotics. Continues to lose weight and continues to have an improvement in her back pain.

## 2014-09-14 ENCOUNTER — Encounter: Payer: Self-pay | Admitting: Physical Therapy

## 2014-09-14 ENCOUNTER — Ambulatory Visit: Payer: BLUE CROSS/BLUE SHIELD | Attending: Sports Medicine | Admitting: Physical Therapy

## 2014-09-14 DIAGNOSIS — M545 Low back pain, unspecified: Secondary | ICD-10-CM

## 2014-09-14 NOTE — Patient Instructions (Signed)
Isometric Hold With Pelvic Floor (Hook-Lying)   Lie with hips and knees bent. Slowly inhale, and then exhale as you squeeze pillow between knees. Pull navel toward spine and tighten pelvic floor. Hold for _3__ seconds. Continue to breathe in and out during hold. Rest for __5_ seconds. Repeat 5__ times. Do _2__ times a day.   Copyright  VHI. All rights reserved.  Bracing With Arm Lift (Hook-Lying)   With neutral spine, tighten pelvic floor and abdominals and hold. Squeeze pillow between knees. Alternating arms, raise over head and return to side. Repeat _10__ times. Do _2__ times a day.   Copyright  VHI. All rights reserved.  [place] [address] [place], [place] [address] Phone # [phone] Fax [phone]

## 2014-09-15 NOTE — Therapy (Signed)
[facility] [address], [place], [place], [address] Phone: [phone]   Fax:  [phone]  Physical Therapy Treatment  Patient Details  Name: Peggy [name] MRN: [id number] Date of Birth: 1975/11/27 Referring Provider:  Silverio Decamp,*  Encounter Date: 09/14/2014      PT End of Session - 09/14/14 0751    Visit Number 2   Date for PT Re-Evaluation 11/17/14   PT Start Time 1445   PT Stop Time 1545   PT Time Calculation (min) 60 min   Activity Tolerance Patient tolerated treatment well   Behavior During Therapy [facility] for tasks assessed/performed  tremors and had to rest      Past Medical History  Diagnosis Date  . Fibromyalgia   . Infertility management   . Fibroid uterus   . DVT (deep venous thrombosis)     Past Surgical History  Procedure Laterality Date  . Cesarean section    . Tonsillectomy    . Foot surgery      right foot  . Cardiac catheterization  approx 8 years ago    for chest pain,   . Cesarean section N/A 02/29/2012    Procedure: Repeat CESAREAN SECTION of baby boy at [place] 8/8;  Surgeon: Daria Pastures, MD;  Location: [facility];  Service: Obstetrics;  Laterality: N/A;    There were no vitals filed for this visit.  Visit Diagnosis:  Midline low back pain without sciatica - Plan: PT plan of care cert/re-cert      Subjective Assessment - 09/14/14 1452    Subjective I felt good after the evaluation.  I did not realize how stiff and bad I was. I have tremors and taking medication for it.    Pertinent History On bed rest for 3 months with pregnancy 12/13- 03/14 before the birth of her son. C-section 03/14 and 2009. six months after first child duagnosed with fibromyalgia and tremors and r/o MS due to lesions on her brain. History of atypical migraines including tremor/ heaviness in back of head/ nauseousness which resloves without medicatioin in 15-20 min. Episodes occur several  times/week now. Bone removed from Rt great toe(metarsal) ~25 years ago has episodic pain in foot.  Coccyx fracture at 39 yr old.   How long can you sit comfortably? 30 min    How long can you stand comfortably? 15 min   How long can you walk comfortably? 20-30 min    Diagnostic tests xrays   Patient Stated Goals decreased pain in LB replace HNP   Currently in Pain? Yes   Pain Score 2    Pain Location Back  abdomen   Pain Orientation Lower;Left   Pain Descriptors / Indicators Dull   Pain Type Chronic pain   Pain Radiating Towards up along spine into left shoulder blade area   Pain Onset More than a month ago   Pain Frequency Intermittent   Aggravating Factors  sitting, standing, walking, lifting, bending   Pain Relieving Factors heat, hot shower   Multiple Pain Sites No            [facility] PT Assessment - 09/15/14 0001    Assessment   Medical Diagnosis low back pain   Onset Date/Surgical Date 06/30/14   Hand Dominance Right   Next MD Visit 08/26/14   Balance Screen   Has the patient fallen in the past 6 months No   Has the patient had a decrease in activity level because  of a fear of falling?  No   Is the patient reluctant to leave their home because of a fear of falling?  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    Observation/Other Assessments   Focus on Therapeutic Outcomes (FOTO)  60%    Sensation   Additional Comments tingling in hands and feet episodically    Posture/Postural Control   Posture Comments head forward; shoulders rounded and elevated; head of the humerus anterior in orientation; licreased lumbar lordosis; extended at hips; hyperextended at knees   AROM   Lumbar Flexion finger tips 8 in from floor   Lumbar Extension 10%   Lumbar - Right Side Bend finger tips 2 in above lat knee crease   Lumbar - Left Side  Bend finger tips 3 in above lat knee crease painful   Lumbar - Right Rotation 20% painful   Lumbar - Left Rotation 20% painful    Strength   Overall Strength Comments grossly WFL's bilat LE's - poor core strength/poor contraction transverse abdominals    Flexibility   Hamstrings Rt 54 degrees; Lt 59 degrees   Quadriceps Rt heel 8 in from buttock; Lt 8.5    Palpation   Spinal mobility pain with spring testing lumbar spine   Palpation comment pain with palpation Lt > Rt paraspinals, piriformis and hip abductors; lats; thoracic and cervical paraspinals; shoulders   Ambulation/Gait   Gait Comments antalgic gait                             PT Education - 09/14/14 0750    Education provided Yes   Education Details abdominal bracing, abdominal bracing with alternate shoulder flexion    Person(s) Educated Patient   Methods Explanation;Demonstration;Tactile cues;Verbal cues;Handout   Comprehension Returned demonstration;Verbalized understanding          PT Short Term Goals - 09/15/14 0758    PT SHORT TERM GOAL #1   Title independent with initial HEP   Time 4   Period Weeks   Status New   PT SHORT TERM GOAL #2   Title pain with daily activites decreased >/= 25%   Time 4   Period Weeks   Status New   PT SHORT TERM GOAL #3   Title understand correct body mechanics with daily tasks   Time 4   Period Weeks   Status New   PT SHORT TERM GOAL #4   Title understand how to perform abdominal massage to reduce trigger points in abdomen   Time 4   Period Weeks   Status New           PT Long Term Goals - 09/15/14 0800    PT LONG TERM GOAL #1   Title independent with HEP and understand how to progress herself   Time 12   Period Weeks   Status New   PT LONG TERM GOAL #2   Title understand ways to manage her pain to be able to perform daily activities   Time 12   Period Weeks   Status New   PT LONG TERM GOAL #3   Title pain with taking care of her kids  decreased >/= 50%   Time 12   Period Weeks   Status New   PT LONG TERM  GOAL #4   Title pain with walking to classes and in the store decreased >/= 50%   Time 12   Period Weeks   Status New   PT LONG TERM GOAL #5   Title abdominal strength >/= 3/5 so patient can lift her 39 year old without straining her back   Time 4   Period Weeks   Status New               Plan - 09/14/14 1532    Clinical Impression Statement Patient is a 39 year old female with chronic low back pain with significant flare up in the past 2 months.  Patient has tremors on exertion.  Patient has had 2 c-sections and several laproscopic surgeries.  Patient reports her pain level is 2/10 in back, abdomen, and right buttocks.  Pain is worse with sitting, standing, liffting, walking, and movement.  Right ilium is anteriorly rotated.  Palpable tenderness located in abdominal, bil. psoas, lumbar region, and gluteals. Decreased trunk movement.  Abdominal strength is 1/5.  Weakness in trunk and hips. Patient would benefit from physical therapy to improve mobility and decrease strength.    Pt will benefit from skilled therapeutic intervention in order to improve on the following deficits Decreased activity tolerance;Decreased balance;Decreased mobility;Decreased strength;Postural dysfunction;Impaired flexibility;Pain;Decreased endurance;Increased muscle spasms;Decreased range of motion   Rehab Potential Good   Clinical Impairments Affecting Rehab Potential None   PT Frequency 2x / week   PT Duration 12 weeks   PT Treatment/Interventions ADLs/Self Care Home Management;Cryotherapy;Electrical Stimulation;Moist Heat;Therapeutic exercise;Therapeutic activities;Ultrasound;Neuromuscular re-education;Patient/family education;Manual techniques;Passive range of motion   PT Next Visit Plan soft tissue work, see if e-stim helped patient, gentle core strength, no nustep, correct pelvic   PT Home Exercise Plan body mechanics   Recommended  Other Services None   Consulted and Agree with Plan of Care Patient        Problem List Patient Active Problem List   Diagnosis Date Noted  . Rigidity of first metatarsophalangeal joint of right foot 09/06/2014  . Obesity 08/26/2014  . Bilateral low back pain without sciatica 08/09/2014  . Complicated migraine 37/85/8850  . Right leg DVT 08/09/2014  . Anemia due to blood loss, acute 05/03/2014  . Fibromyalgia 07/06/2010    Vernita Tague,PT 09/15/2014, 8:04 AM  [facility] [address], [place], [place], [address] Phone: [phone]   Fax:  [phone]

## 2014-09-21 ENCOUNTER — Encounter: Payer: Self-pay | Admitting: Physical Therapy

## 2014-09-21 ENCOUNTER — Ambulatory Visit: Payer: BLUE CROSS/BLUE SHIELD | Attending: Sports Medicine | Admitting: Physical Therapy

## 2014-09-21 DIAGNOSIS — M545 Low back pain, unspecified: Secondary | ICD-10-CM

## 2014-09-21 NOTE — Therapy (Signed)
[facility] [address], [place], [place], [address] Phone: [phone]   Fax:  [phone]  Physical Therapy Treatment  Patient Details  Name: Peggy [name] MRN: [id number] Date of Birth: Jan 21, 1975 Referring Provider:  Silverio Decamp,*  Encounter Date: 09/21/2014      PT End of Session - 09/21/14 1020    Visit Number 3   Date for PT Re-Evaluation 11/17/14   PT Start Time 4174   PT Stop Time 1115   PT Time Calculation (min) 60 min   Activity Tolerance Patient tolerated treatment well   Behavior During Therapy [facility] for tasks assessed/performed      Past Medical History  Diagnosis Date  . Fibromyalgia   . Infertility management   . Fibroid uterus   . DVT (deep venous thrombosis)     Past Surgical History  Procedure Laterality Date  . Cesarean section    . Tonsillectomy    . Foot surgery      right foot  . Cardiac catheterization  approx 8 years ago    for chest pain,   . Cesarean section N/A 02/29/2012    Procedure: Repeat CESAREAN SECTION of baby boy at [place] 8/8;  Surgeon: Daria Pastures, MD;  Location: [facility];  Service: Obstetrics;  Laterality: N/A;    There were no vitals filed for this visit.  Visit Diagnosis:  Midline low back pain without sciatica      Subjective Assessment - 09/21/14 1020    Subjective I felt better after last visit. I had less cramping pain. I had 1 week with less issues.    Pertinent History On bed rest for 3 months with pregnancy 12/13- 03/14 before the birth of her son. C-section 03/14 and 2009. six months after first child duagnosed with fibromyalgia and tremors and r/o MS due to lesions on her brain. History of atypical migraines including tremor/ heaviness in back of head/ nauseousness which resloves without medicatioin in 15-20 min. Episodes occur several times/week now. Bone removed from Rt great toe(metarsal) ~25 years ago has episodic pain in foot.   Coccyx fracture at 39 yr old.   How long can you sit comfortably? 30 min    How long can you stand comfortably? 15 min   How long can you walk comfortably? 20-30 min    Diagnostic tests xrays   Patient Stated Goals decreased pain in LB replace HNP   Currently in Pain? Yes   Pain Score 1    Pain Location Abdomen  Back   Pain Orientation Right   Pain Descriptors / Indicators Dull;Aching   Pain Type Chronic pain   Pain Onset More than a month ago   Pain Frequency Intermittent   Aggravating Factors  sitting, standing, walking, lifting, bending   Pain Relieving Factors heat, hot shower   Multiple Pain Sites No            [facility] PT Assessment - 09/21/14 0001    AROM   Lumbar Flexion finger tips 8 in from floor   Lumbar Extension decreased by 75%   Lumbar - Right Side Bend decreased by 25%   Lumbar - Left Side Bend decreased by 25%   Lumbar - Right Rotation decreased by 20%   Lumbar - Left Rotation decreased by 25%   Palpation   SI assessment  left ilium is posteriorly rotated  [facility] Adult PT Treatment/Exercise - 09/21/14 0001    Moist Heat Therapy   Number Minutes Moist Heat 20 Minutes   Moist Heat Location Lumbar Spine  abdomen   Electrical Stimulation   Electrical Stimulation Location lumbar/abdomen in supine   Electrical Stimulation Action IFC   Electrical Stimulation Parameters to tolerance   Electrical Stimulation Goals Pain   Manual Therapy   Manual Therapy Soft tissue mobilization;Muscle Energy Technique;Joint mobilization   Joint Mobilization grade 1 oscillations to L1-L5; sacral mobs   Soft tissue mobilization bil. lumbar paraspinals and abdominal s   Myofascial Release suprapubically   Muscle Energy Technique correct left post. ilium                PT Education - 09/21/14 1057    Education provided No          PT Short Term Goals - 09/21/14 1057    PT SHORT TERM GOAL #1   Title independent with initial HEP    Time 4   Period Weeks   Status On-going   PT SHORT TERM GOAL #2   Title pain with daily activites decreased >/= 25%   Time 4   Period Weeks   Status On-going  15% better   PT SHORT TERM GOAL #3   Title understand correct body mechanics with daily tasks   Time 4   Period Weeks   Status On-going  still learning   PT SHORT TERM GOAL #4   Title understand how to perform abdominal massage to reduce trigger points in abdomen   Time 4   Period Weeks   Status On-going           PT Long Term Goals - 09/15/14 0800    PT LONG TERM GOAL #1   Title independent with HEP and understand how to progress herself   Time 12   Period Weeks   Status New   PT LONG TERM GOAL #2   Title understand ways to manage her pain to be able to perform daily activities   Time 12   Period Weeks   Status New   PT LONG TERM GOAL #3   Title pain with taking care of her kids decreased >/= 50%   Time 12   Period Weeks   Status New   PT LONG TERM GOAL #4   Title pain with walking to classes and in the store decreased >/= 50%   Time 12   Period Weeks   Status New   PT LONG TERM GOAL #5   Title abdominal strength >/= 3/5 so patient can lift her 41 year old without straining her back   Time 12   Period Weeks   Status New               Plan - 09/21/14 1058    Clinical Impression Statement Patient is a 39 year old female with chronic low back pain.  Patient pelvis is in correct alignment after therapy.  Patient not able to tolerate more than grade 1 mobs to lumbar.  Lumbar ROM is restricted.  Patient moves very cautiously while turning in bed.  Patient able to tolerate increased  pressure  to abdomen.  After therapy patient was able to stand equally on feet for first time.  Patient would benefit from physical therapy to improve strength and reduce pain.    Pt will benefit from skilled therapeutic intervention in order to improve on the following deficits Decreased activity tolerance;Decreased  balance;Decreased  mobility;Decreased strength;Postural dysfunction;Impaired flexibility;Pain;Decreased endurance;Increased muscle spasms;Decreased range of motion   Rehab Potential Good   Clinical Impairments Affecting Rehab Potential None   PT Frequency 2x / week   PT Duration 12 weeks   PT Treatment/Interventions ADLs/Self Care Home Management;Cryotherapy;Electrical Stimulation;Moist Heat;Therapeutic exercise;Therapeutic activities;Ultrasound;Neuromuscular re-education;Patient/family education;Manual techniques;Passive range of motion   PT Next Visit Plan soft tissue work, see if e-stim helped patient, gentle core strength, no nustep, correct pelvic   PT Home Exercise Plan body mechanics, abdominal massage   Consulted and Agree with Plan of Care Patient        Problem List Patient Active Problem List   Diagnosis Date Noted  . Rigidity of first metatarsophalangeal joint of right foot 09/06/2014  . Obesity 08/26/2014  . Bilateral low back pain without sciatica 08/09/2014  . Complicated migraine 48/88/9169  . Right leg DVT 08/09/2014  . Anemia due to blood loss, acute 05/03/2014  . Fibromyalgia 07/06/2010    Cyrilla Durkin,PT 09/21/2014, 11:01 AM  [facility] [address] [place], [place], [address] Phone: [phone]   Fax:  [phone]

## 2014-09-22 ENCOUNTER — Ambulatory Visit: Payer: BLUE CROSS/BLUE SHIELD | Admitting: Physical Therapy

## 2014-09-22 ENCOUNTER — Encounter: Payer: Self-pay | Admitting: Physical Therapy

## 2014-09-22 DIAGNOSIS — M545 Low back pain, unspecified: Secondary | ICD-10-CM

## 2014-09-22 NOTE — Therapy (Signed)
[facility] [address], [place], [place], [address] Phone: [phone]   Fax:  [phone]  Physical Therapy Treatment  Patient Details  Name: Peggy [name] MRN: [id number] Date of Birth: May 31, 1975 Referring Provider:  Silverio Decamp,*  Encounter Date: 09/22/2014      PT End of Session - 09/22/14 1627    Visit Number 4   Date for PT Re-Evaluation 11/17/14   PT Start Time 1625   PT Stop Time 1710   PT Time Calculation (min) 45 min   Activity Tolerance Patient tolerated treatment well   Behavior During Therapy [facility] for tasks assessed/performed      Past Medical History  Diagnosis Date  . Fibromyalgia   . Infertility management   . Fibroid uterus   . DVT (deep venous thrombosis)     Past Surgical History  Procedure Laterality Date  . Cesarean section    . Tonsillectomy    . Foot surgery      right foot  . Cardiac catheterization  approx 8 years ago    for chest pain,   . Cesarean section N/A 02/29/2012    Procedure: Repeat CESAREAN SECTION of baby boy at [place] 8/8;  Surgeon: Daria Pastures, MD;  Location: [facility];  Service: Obstetrics;  Laterality: N/A;    There were no vitals filed for this visit.  Visit Diagnosis:  Midline low back pain without sciatica      Subjective Assessment - 09/22/14 1628    Subjective I have been doing the exercises.  My back is a little sore. I am not as wiggly.  I can walk around more. When at the park she was able to interact with her two boys compared to not being able to.    Pertinent History On bed rest for 3 months with pregnancy 12/13- 03/14 before the birth of her son. C-section 03/14 and 2009. six months after first child duagnosed with fibromyalgia and tremors and r/o MS due to lesions on her brain. History of atypical migraines including tremor/ heaviness in back of head/ nauseousness which resloves without medicatioin in 15-20 min. Episodes occur  several times/week now. Bone removed from Rt great toe(metarsal) ~25 years ago has episodic pain in foot.  Coccyx fracture at 39 yr old.   How long can you sit comfortably? 30 min    How long can you stand comfortably? 15 min   How long can you walk comfortably? 20-30 min    Diagnostic tests xrays   Patient Stated Goals decreased pain in LB replace HNP   Currently in Pain? Yes   Pain Score 4    Pain Location Back   Pain Orientation Right;Left   Pain Descriptors / Indicators Dull;Sharp;Spasm   Pain Type Chronic pain   Pain Onset More than a month ago   Pain Frequency Intermittent   Aggravating Factors  sitting, standing, walking, lifting, bending   Pain Relieving Factors heat, hot shower   Multiple Pain Sites No                         [facility] Adult PT Treatment/Exercise - 09/22/14 0001    Lumbar Exercises: Stretches   Single Knee to Chest Stretch 2 reps;20 seconds  bil.    Lumbar Exercises: Supine   Ab Set 10 reps;5 seconds  with breathing and pelvic floor contraction   Clam 10 reps  tactile cues to brace abdominals, both legs  Dead Bug 10 reps  just arms with abdominal bracing   Moist Heat Therapy   Number Minutes Moist Heat 15 Minutes   Moist Heat Location Lumbar Spine  prone   Electrical Stimulation   Electrical Stimulation Location lumbar/abdomen in supine   Electrical Stimulation Action IFC   Electrical Stimulation Parameters to tolerance   Electrical Stimulation Goals Pain   Manual Therapy   Manual Therapy Soft tissue mobilization   Soft tissue mobilization bil. lumbar paraspinals                 PT Education - 09/22/14 1651    Education provided No          PT Short Term Goals - 09/21/14 1057    PT SHORT TERM GOAL #1   Title independent with initial HEP   Time 4   Period Weeks   Status On-going   PT SHORT TERM GOAL #2   Title pain with daily activites decreased >/= 25%   Time 4   Period Weeks   Status On-going  15% better    PT SHORT TERM GOAL #3   Title understand correct body mechanics with daily tasks   Time 4   Period Weeks   Status On-going  still learning   PT SHORT TERM GOAL #4   Title understand how to perform abdominal massage to reduce trigger points in abdomen   Time 4   Period Weeks   Status On-going           PT Long Term Goals - 09/15/14 0800    PT LONG TERM GOAL #1   Title independent with HEP and understand how to progress herself   Time 12   Period Weeks   Status New   PT LONG TERM GOAL #2   Title understand ways to manage her pain to be able to perform daily activities   Time 12   Period Weeks   Status New   PT LONG TERM GOAL #3   Title pain with taking care of her kids decreased >/= 50%   Time 12   Period Weeks   Status New   PT LONG TERM GOAL #4   Title pain with walking to classes and in the store decreased >/= 50%   Time 12   Period Weeks   Status New   PT LONG TERM GOAL #5   Title abdominal strength >/= 3/5 so patient can lift her 41 year old without straining her back   Time 26   Period Weeks   Status New               Plan - 09/22/14 1651    Clinical Impression Statement Patient is a 39 year old female with chronic low back pain.  Patient is able to go to the park and interact with her children more.  Patient is able to not have to wiggle as much to get comfortable.  Patient is consistently exercising daily. Patient will benefit from physical therapy to increase core strength and reduce pain.    Pt will benefit from skilled therapeutic intervention in order to improve on the following deficits Decreased activity tolerance;Decreased balance;Decreased mobility;Decreased strength;Postural dysfunction;Impaired flexibility;Pain;Decreased endurance;Increased muscle spasms;Decreased range of motion   Rehab Potential Good   Clinical Impairments Affecting Rehab Potential None   PT Frequency 2x / week   PT Duration 12 weeks   PT Treatment/Interventions ADLs/Self  Care Home Management;Cryotherapy;Electrical Stimulation;Moist Heat;Therapeutic exercise;Therapeutic activities;Ultrasound;Neuromuscular re-education;Patient/family education;Manual techniques;Passive range of motion  PT Next Visit Plan soft tissue work,  e-stim, gentle core strength,  correct pelvic   Consulted and Agree with Plan of Care Patient        Problem List Patient Active Problem List   Diagnosis Date Noted  . Rigidity of first metatarsophalangeal joint of right foot 09/06/2014  . Obesity 08/26/2014  . Bilateral low back pain without sciatica 08/09/2014  . Complicated migraine 14/97/0263  . Right leg DVT 08/09/2014  . Anemia due to blood loss, acute 05/03/2014  . Fibromyalgia 07/06/2010    GRAY,CHERYL,PT 09/22/2014, 4:55 PM  [facility] [address] [place], [place], [address] Phone: [phone]   Fax:  [phone]

## 2014-09-23 ENCOUNTER — Ambulatory Visit (INDEPENDENT_AMBULATORY_CARE_PROVIDER_SITE_OTHER): Payer: BLUE CROSS/BLUE SHIELD | Admitting: Sports Medicine

## 2014-09-23 ENCOUNTER — Encounter: Payer: Self-pay | Admitting: Sports Medicine

## 2014-09-23 VITALS — BP 116/84 | HR 102 | Ht 67.0 in | Wt 180.0 lb

## 2014-09-23 DIAGNOSIS — M25674 Stiffness of right foot, not elsewhere classified: Secondary | ICD-10-CM

## 2014-09-23 DIAGNOSIS — M545 Low back pain, unspecified: Secondary | ICD-10-CM

## 2014-09-23 DIAGNOSIS — M2021 Hallux rigidus, right foot: Secondary | ICD-10-CM

## 2014-09-23 DIAGNOSIS — E669 Obesity, unspecified: Secondary | ICD-10-CM

## 2014-09-23 MED ORDER — PHENTERMINE HCL 37.5 MG PO TABS: ORAL_TABLET | ORAL | Status: DC

## 2014-09-23 NOTE — Assessment & Plan Note (Signed)
Resolved with orthotics and injection.

## 2014-09-23 NOTE — Progress Notes (Signed)
  Subjective:    CC: Follow-up  HPI: Obesity: Fantastic weight loss after the first month.  Left first MTP arthritis: Completely resolved after MTP injection.  Lumbar radiculopathy: Continues to improve with formal physical therapy and weight loss.  Past medical history, Surgical history, Family history not pertinant except as noted below, Social history, Allergies, and medications have been entered into the medical record, reviewed, and no changes needed.   Review of Systems: No fevers, chills, night sweats, weight loss, chest pain, or shortness of breath.   Objective:    General: Well Developed, well nourished, and in no acute distress.  Neuro: Alert and oriented x3, extra-ocular muscles intact, sensation grossly intact.  HEENT: Normocephalic, atraumatic, pupils equal round reactive to light, neck supple, no masses, no lymphadenopathy, thyroid nonpalpable.  Skin: Warm and dry, no rashes. Cardiac: Regular rate and rhythm, no murmurs rubs or gallops, no lower extremity edema.  Respiratory: Clear to auscultation bilaterally. Not using accessory muscles, speaking in full sentences.  Impression and Recommendations:

## 2014-09-23 NOTE — Assessment & Plan Note (Signed)
Continues to improve with physical therapy and weight loss.

## 2014-09-23 NOTE — Assessment & Plan Note (Signed)
11 pound weight loss after the first month on phentermine. Doing extremely well, refilling medication, return in one month.

## 2014-09-27 ENCOUNTER — Ambulatory Visit: Payer: BLUE CROSS/BLUE SHIELD | Admitting: Physical Therapy

## 2014-09-27 ENCOUNTER — Encounter: Payer: Self-pay | Admitting: Physical Therapy

## 2014-09-27 DIAGNOSIS — M545 Low back pain, unspecified: Secondary | ICD-10-CM

## 2014-09-27 NOTE — Patient Instructions (Addendum)
About Abdominal Massage  Abdominal massage, also called external colon massage, is a self-treatment circular massage technique that can reduce and eliminate gas and ease constipation. The colon naturally contracts in waves in a clockwise direction starting from inside the right hip, moving up toward the ribs, across the belly, and down inside the left hip.  When you perform circular abdominal massage, you help stimulate your colon's normal wave pattern of movement called peristalsis.  It is most beneficial when done after eating.  Positioning You can practice abdominal massage with oil while lying down, or in the shower with soap.  Some people find that it is just as effective to do the massage through clothing while sitting or standing.  How to Massage Start by placing your finger tips or knuckles on your right side, just inside your hip bone.  . Make small circular movements while you move upward toward your rib cage.   . Once you reach the bottom right side of your rib cage, take your circular movements across to the left side of the bottom of your rib cage.  . Next, move downward until you reach the inside of your left hip bone.  This is the path your feces travel in your colon. . Continue to perform your abdominal massage in this pattern for 10 minutes each day.     You can apply as much pressure as is comfortable in your massage.  Start gently and build pressure as you continue to practice.  Notice any areas of pain as you massage; areas of slight pain may be relieved as you massage, but if you have areas of significant or intense pain, consult with your healthcare provider.  Other Considerations . General physical activity including bending and stretching can have a beneficial massage-like effect on the colon.  Deep breathing can also stimulate the colon because breathing deeply activates the same nervous system that supplies the colon.   . Abdominal massage should always be used in  combination with a bowel-conscious diet that is high in the proper type of fiber for you, fluids (primarily water), and a regular exercise program. Toileting Techniques for Bowel Movements (Defecation) Using your belly (abdomen) and pelvic floor muscles to have a bowel movement is usually instinctive.  Sometimes people can have problems with these muscles and have to relearn proper defecation (emptying) techniques.  If you have weakness in your muscles, organs that are falling out, decreased sensation in your pelvis, or ignore your urge to go, you may find yourself straining to have a bowel movement.  You are straining if you are: . holding your breath or taking in a huge gulp of air and holding it  . keeping your lips and jaw tensed and closed tightly . turning red in the face because of excessive pushing or forcing . developing or worsening your  hemorrhoids . getting faint while pushing . not emptying completely and have to defecate many times a day  If you are straining, you are actually making it harder for yourself to have a bowel movement.  Many people find they are pulling up with the pelvic floor muscles and closing off instead of opening the anus. Due to lack pelvic floor relaxation and coordination the abdominal muscles, one has to work harder to push the feces out.  Many people have never been taught how to defecate efficiently and effectively.  Notice what happens to your body when you are having a bowel movement.  While you are sitting on the   toilet pay attention to the following areas: . Jaw and mouth position . Angle of your hips   . Whether your feet touch the ground or not . Arm placement  . Spine position . Waist . Belly tension . Anus (opening of the anal canal)  An Evacuation/Defecation Plan   Here are the 4 basic points:  1. Lean forward enough for your elbows to rest on your knees 2. Support your feet on the floor or use a low stool if your feet don't touch the floor   3. Push out your belly as if you have swallowed a beach ball-you should feel a widening of your waist 4. Open and relax your pelvic floor muscles, rather than tightening around the anus      The following conditions my require modifications to your toileting posture:  . If you have had surgery in the past that limits your back, hip, pelvic, knee or ankle flexibility . Constipation   Your healthcare practitioner may make the following additional suggestions and adjustments:  1) Sit on the toilet  a) Make sure your feet are supported. b) Notice your hip angle and spine position-most people find it effective to lean forward or raise their knees, which can help the muscles around the anus to relax  c) When you lean forward, place your forearms on your thighs for support  2) Relax suggestions a) Breath deeply in through your nose and out slowly through your mouth as if you are smelling the flowers and blowing out the candles. b) To become aware of how to relax your muscles, contracting and releasing muscles can be helpful.  Pull your pelvic floor muscles in tightly by using the image of holding back gas, or closing around the anus (visualize making a circle smaller) and lifting the anus up and in.  Then release the muscles and your anus should drop down and feel open. Repeat 5 times ending with the feeling of relaxation. c) Keep your pelvic floor muscles relaxed; let your belly bulge out. d) The digestive tract starts at the mouth and ends at the anal opening, so be sure to relax both ends of the tube.  Place your tongue on the roof of your mouth with your teeth separated.  This helps relax your mouth and will help to relax the anus at the same time.  3) Empty (defecation) a) Keep your pelvic floor and sphincter relaxed, then bulge your anal muscles.  Make the anal opening wide.  b) Stick your belly out as if you have swallowed a beach ball. c) Make your belly wall hard using your belly  muscles while continuing to breathe. Doing this makes it easier to open your anus. d) Breath out and give a grunt (or try using other sounds such as ahhhh, shhhhh, ohhhh or grrrrrrr).  4) Finish a) As you finish your bowel movement, pull the pelvic floor muscles up and in.  This will leave your anus in the proper place rather than remaining pushed out and down. If you leave your anus pushed out and down, it will start to feel as though that is normal and give you incorrect signals about needing to have a bowel movement.    Mutasim Tuckey, PT [facility] [address] [place], [place] [address]  

## 2014-09-27 NOTE — Therapy (Signed)
[facility] [address] [place], [place], [address] Phone: [phone]   Fax:  [phone]  Physical Therapy Treatment  Patient Details  Name: Peggy [name] MRN: [id number] Date of Birth: [birth year] Referring Provider:  Silverio Decamp,*  Encounter Date: 09/27/2014      PT End of Session - 09/27/14 0852    Visit Number 5   Date for PT Re-Evaluation 11/17/14   PT Start Time 0850   PT Stop Time 0950   PT Time Calculation (min) 60 min   Activity Tolerance Patient tolerated treatment well   Behavior During Therapy [facility] for tasks assessed/performed      Past Medical History  Diagnosis Date  . Fibromyalgia   . Infertility management   . Fibroid uterus   . DVT (deep venous thrombosis)     Past Surgical History  Procedure Laterality Date  . Cesarean section    . Tonsillectomy    . Foot surgery      right foot  . Cardiac catheterization  approx 8 years ago    for chest pain,   . Cesarean section N/A 02/29/2012    Procedure: Repeat CESAREAN SECTION of baby boy at [place] 8/8;  Surgeon: Daria Pastures, MD;  Location: [facility];  Service: Obstetrics;  Laterality: N/A;    There were no vitals filed for this visit.  Visit Diagnosis:  Midline low back pain without sciatica      Subjective Assessment - 09/27/14 0853    Subjective I feel good.    Pertinent History On bed rest for 3 months with pregnancy 12/13- 03/14 before the birth of her son. C-section 03/14 and 2009. six months after first child duagnosed with fibromyalgia and tremors and r/o MS due to lesions on her brain. History of atypical migraines including tremor/ heaviness in back of head/ nauseousness which resloves without medicatioin in 15-20 min. Episodes occur several times/week now. Bone removed from Rt great toe(metarsal) ~25 years ago has episodic pain in foot.  Coccyx fracture at 39 yr old.   How long can you sit comfortably? 20 min with  wiggly   How long can you stand comfortably? 20 min   How long can you walk comfortably? 20-30 min   Diagnostic tests xrays   Patient Stated Goals decreased pain in LB replace HNP   Currently in Pain? Yes   Pain Score 1    Pain Location Back   Pain Orientation Right;Left   Pain Descriptors / Indicators Dull   Pain Type Chronic pain   Pain Onset More than a month ago   Pain Frequency Intermittent   Aggravating Factors  bending down alot, lifting,    Pain Relieving Factors heat , hot shower   Multiple Pain Sites No            [facility] PT Assessment - 09/27/14 0001    Palpation   SI assessment  pelvis in correct alignment                     [facility] Adult PT Treatment/Exercise - 09/27/14 0001    Self-Care   Self-Care --  toileting technique   Moist Heat Therapy   Number Minutes Moist Heat 20 Minutes   Moist Heat Location Lumbar Spine  prone   Electrical Stimulation   Electrical Stimulation Location lumbar   prone   Electrical Stimulation Action IFC   Electrical Stimulation Parameters to patient tolerance 20 min   Electrical  Stimulation Goals Pain   Manual Therapy   Manual Therapy Soft tissue mobilization   Soft tissue mobilization abdominal massage; bil. lumbar and thoracic paraspinals                PT Education - 09/27/14 0915    Education provided Yes   Education Details abdominal massage, toileting technique   Person(s) Educated Patient   Methods Explanation;Demonstration;Tactile cues;Verbal cues;Handout   Comprehension Returned demonstration;Verbalized understanding          PT Short Term Goals - 09/27/14 0857    PT SHORT TERM GOAL #1   Title independent with initial HEP   Time 4   Period Weeks   Status Achieved   PT SHORT TERM GOAL #2   Title pain with daily activites decreased >/= 25%   Time 4   Period Weeks   Status Achieved   PT SHORT TERM GOAL #4   Title understand how to perform abdominal massage to reduce trigger points in  abdomen   Time 4   Period Weeks   Status Achieved           PT Long Term Goals - 09/15/14 0800    PT LONG TERM GOAL #1   Title independent with HEP and understand how to progress herself   Time 12   Period Weeks   Status New   PT LONG TERM GOAL #2   Title understand ways to manage her pain to be able to perform daily activities   Time 12   Period Weeks   Status New   PT LONG TERM GOAL #3   Title pain with taking care of her kids decreased >/= 50%   Time 12   Period Weeks   Status New   PT LONG TERM GOAL #4   Title pain with walking to classes and in the store decreased >/= 50%   Time 12   Period Weeks   Status New   PT LONG TERM GOAL #5   Title abdominal strength >/= 3/5 so patient can lift her 41 year old without straining her back   Time 75   Period Weeks   Status New               Plan - 09/27/14 0929    Clinical Impression Statement Patient is a 39 year old female with chronic low back pain.  Patient reports her overall pain decreased by 25%.  Patient pelvis in correct alignment.  Trigger points in diaphragm and right lower quadrant.  Patient is now able to stand to cook dinner.  Patient is now able to sit 20 minutes without weight shifting.  Patient has met STG's 1,2, and 4.  Patient will benefit from physical therapy to increased core strength and reduce pain.    Pt will benefit from skilled therapeutic intervention in order to improve on the following deficits Decreased activity tolerance;Decreased balance;Decreased mobility;Decreased strength;Postural dysfunction;Impaired flexibility;Pain;Decreased endurance;Increased muscle spasms;Decreased range of motion   Rehab Potential Good   Clinical Impairments Affecting Rehab Potential None   PT Frequency 2x / week   PT Duration 12 weeks   PT Treatment/Interventions ADLs/Self Care Home Management;Cryotherapy;Electrical Stimulation;Moist Heat;Therapeutic exercise;Therapeutic activities;Ultrasound;Neuromuscular  re-education;Patient/family education;Manual techniques;Passive range of motion   PT Next Visit Plan soft tissue work,  e-stim, gentle core strength, body mechanics,    PT Home Exercise Plan body mechanics, back strength   Consulted and Agree with Plan of Care Patient        Problem List Patient  Active Problem List   Diagnosis Date Noted  . Rigidity of first metatarsophalangeal joint of right foot 09/06/2014  . Obesity 08/26/2014  . Bilateral low back pain without sciatica 08/09/2014  . Complicated migraine 77/41/2878  . Right leg DVT 08/09/2014  . Anemia due to blood loss, acute 05/03/2014  . Fibromyalgia 07/06/2010    GRAY,CHERYL,PT 09/27/2014, 9:33 AM  [facility] [address] [place], [place], [address] Phone: [phone]   Fax:  [phone]

## 2014-09-28 ENCOUNTER — Encounter: Payer: BLUE CROSS/BLUE SHIELD | Admitting: Physical Therapy

## 2014-09-29 ENCOUNTER — Ambulatory Visit: Payer: BLUE CROSS/BLUE SHIELD | Admitting: Physical Therapy

## 2014-10-03 ENCOUNTER — Ambulatory Visit: Payer: BLUE CROSS/BLUE SHIELD | Admitting: Physical Therapy

## 2014-10-05 ENCOUNTER — Encounter: Payer: Self-pay | Admitting: Physical Therapy

## 2014-10-05 ENCOUNTER — Ambulatory Visit: Payer: BLUE CROSS/BLUE SHIELD | Admitting: Physical Therapy

## 2014-10-05 DIAGNOSIS — M545 Low back pain, unspecified: Secondary | ICD-10-CM

## 2014-10-05 NOTE — Therapy (Signed)
[facility] [address] [place], [place], [address] Phone: [phone]   Fax:  [phone]  Physical Therapy Treatment  Patient Details  Name: Peggy [name] MRN: [id number] Date of Birth: September 13, 1975 Referring Provider:  Silverio Decamp,*  Encounter Date: 10/05/2014      PT End of Session - 10/05/14 0948    Visit Number 6   Date for PT Re-Evaluation 11/17/14   PT Start Time 0933   PT Stop Time 1026   PT Time Calculation (min) 53 min   Activity Tolerance Patient tolerated treatment well   Behavior During Therapy [facility] for tasks assessed/performed      Past Medical History  Diagnosis Date  . Fibromyalgia   . Infertility management   . Fibroid uterus   . DVT (deep venous thrombosis)     Past Surgical History  Procedure Laterality Date  . Cesarean section    . Tonsillectomy    . Foot surgery      right foot  . Cardiac catheterization  approx 8 years ago    for chest pain,   . Cesarean section N/A 02/29/2012    Procedure: Repeat CESAREAN SECTION of baby boy at [place] 8/8;  Surgeon: Daria Pastures, MD;  Location: [facility];  Service: Obstetrics;  Laterality: N/A;    There were no vitals filed for this visit.  Visit Diagnosis:  Midline low back pain without sciatica      Subjective Assessment - 10/05/14 0936    Subjective My back feels 30%-40% better.    Pertinent History On bed rest for 3 months with pregnancy 12/13- 03/14 before the birth of her son. C-section 03/14 and 2009. six months after first child duagnosed with fibromyalgia and tremors and r/o MS due to lesions on her brain. History of atypical migraines including tremor/ heaviness in back of head/ nauseousness which resloves without medicatioin in 15-20 min. Episodes occur several times/week now. Bone removed from Rt great toe(metarsal) ~25 years ago has episodic pain in foot.  Coccyx fracture at 39 yr old.   Currently in Pain? No/denies                         [facility] Adult PT Treatment/Exercise - 10/05/14 0001    Lumbar Exercises: Stretches   Single Knee to Chest Stretch 3 reps;20 seconds  each leg   Lumbar Exercises: Supine   Ab Set 10 reps;5 seconds  with breathing and pelvic floor contraction   Clam 10 reps  left and right side x10 each with tactile & vc''s for techn    Dead Bug 10 reps  just arms with abdominal bracing   Bridge 5 reps   Moist Heat Therapy   Number Minutes Moist Heat 20 Minutes   Moist Heat Location Lumbar Spine   Electrical Stimulation   Electrical Stimulation Location lumbar   prone   Electrical Stimulation Action IFC   Electrical Stimulation Parameters to patient tolerance   Electrical Stimulation Goals Pain   Manual Therapy   Manual Therapy Soft tissue mobilization   Soft tissue mobilization abdominal massage; bil. lumbar and thoracic paraspinals                  PT Short Term Goals - 10/05/14 0956    PT SHORT TERM GOAL #1   Title independent with initial HEP   Time 4   Period Weeks   Status Achieved   PT SHORT TERM  GOAL #2   Title pain with daily activites decreased >/= 25%   Time 4   Period Weeks   Status Achieved   PT SHORT TERM GOAL #3   Title understand correct body mechanics with daily tasks   Time 4   Period Weeks   Status On-going   PT SHORT TERM GOAL #4   Title understand how to perform abdominal massage to reduce trigger points in abdomen   Time 4   Period Weeks   Status Achieved           PT Long Term Goals - 09/15/14 0800    PT LONG TERM GOAL #1   Title independent with HEP and understand how to progress herself   Time 12   Period Weeks   Status New   PT LONG TERM GOAL #2   Title understand ways to manage her pain to be able to perform daily activities   Time 12   Period Weeks   Status New   PT LONG TERM GOAL #3   Title pain with taking care of her kids decreased >/= 50%   Time 12   Period Weeks   Status New   PT LONG  TERM GOAL #4   Title pain with walking to classes and in the store decreased >/= 50%   Time 12   Period Weeks   Status New   PT LONG TERM GOAL #5   Title abdominal strength >/= 3/5 so patient can lift her 41 year old without straining her back   Time 26   Period [place] - 10/05/14 0951    Clinical Impression Statement Patient is a 39 y.o. female with chronic low back pain. Patient reports no complain with pain today, but noticed pain with activities with pelvicfloor contraction. Pt is now able to sleep thru the night and does not need to get up for toiletting. Pt will continue to benefit from skilled PR   Pt will benefit from skilled therapeutic intervention in order to improve on the following deficits Decreased activity tolerance;Decreased balance;Decreased mobility;Decreased strength;Postural dysfunction;Impaired flexibility;Pain;Decreased endurance;Increased muscle spasms;Decreased range of motion   Rehab Potential Good   PT Frequency 2x / week   PT Duration 12 weeks   PT Treatment/Interventions ADLs/Self Care Home Management;Cryotherapy;Electrical Stimulation;Moist Heat;Therapeutic exercise;Therapeutic activities;Ultrasound;Neuromuscular re-education;Patient/family education;Manual techniques;Passive range of motion   PT Next Visit Plan soft tissue work,  e-stim, gentle core strength, body mechanics,    PT Home Exercise Plan body mechanics, back strength   Consulted and Agree with Plan of Care Patient        Problem List Patient Active Problem List   Diagnosis Date Noted  . Rigidity of first metatarsophalangeal joint of right foot 09/06/2014  . Obesity 08/26/2014  . Bilateral low back pain without sciatica 08/09/2014  . Complicated migraine 89/21/1941  . Right leg DVT 08/09/2014  . Anemia due to blood loss, acute 05/03/2014  . Fibromyalgia 07/06/2010    NAUMANN-HOUEGNIFIO,ELKE PTA 10/05/2014, 10:02 AM  [facility] [address], [place], [place], [address] Phone: [phone]   Fax:  [phone]

## 2014-10-06 ENCOUNTER — Encounter: Payer: Self-pay | Admitting: Physical Therapy

## 2014-10-06 ENCOUNTER — Ambulatory Visit: Payer: BLUE CROSS/BLUE SHIELD | Admitting: Physical Therapy

## 2014-10-06 DIAGNOSIS — M545 Low back pain, unspecified: Secondary | ICD-10-CM

## 2014-10-06 NOTE — Therapy (Signed)
[facility] [address] [place], [place], [address] Phone: [phone]   Fax:  [phone]  Physical Therapy Treatment  Patient Details  Name: Peggy [name] MRN: [id number] Date of Birth: 1975-07-20 Referring Provider:  Silverio Decamp,*  Encounter Date: 10/06/2014      PT End of Session - 10/06/14 1452    Visit Number 7   Date for PT Re-Evaluation 11/17/14   PT Start Time 1445   PT Stop Time 1545   PT Time Calculation (min) 60 min   Activity Tolerance Patient tolerated treatment well   Behavior During Therapy [facility] for tasks assessed/performed      Past Medical History  Diagnosis Date  . Fibromyalgia   . Infertility management   . Fibroid uterus   . DVT (deep venous thrombosis)     Past Surgical History  Procedure Laterality Date  . Cesarean section    . Tonsillectomy    . Foot surgery      right foot  . Cardiac catheterization  approx 8 years ago    for chest pain,   . Cesarean section N/A 02/29/2012    Procedure: Repeat CESAREAN SECTION of baby boy at [place] 8/8;  Surgeon: Daria Pastures, MD;  Location: [facility];  Service: Obstetrics;  Laterality: N/A;    There were no vitals filed for this visit.  Visit Diagnosis:  Midline low back pain without sciatica      Subjective Assessment - 10/06/14 1452    Subjective I am sore  from my fibromyalgia and have a limp.    Pertinent History On bed rest for 3 months with pregnancy 12/13- 03/14 before the birth of her son. C-section 03/14 and 2009. six months after first child duagnosed with fibromyalgia and tremors and r/o MS due to lesions on her brain. History of atypical migraines including tremor/ heaviness in back of head/ nauseousness which resloves without medicatioin in 15-20 min. Episodes occur several times/week now. Bone removed from Rt great toe(metarsal) ~25 years ago has episodic pain in foot.  Coccyx fracture at 39 yr old.   How long can  you sit comfortably? 20 min with no wiggles   How long can you stand comfortably? 60 min with movement   How long can you walk comfortably? 60 min   Patient Stated Goals decreased pain in LB replace HNP   Currently in Pain? Yes   Pain Score 5    Pain Location --  all over   Pain Orientation Right;Left   Pain Descriptors / Indicators Aching;Dull;Sharp   Pain Type Chronic pain   Pain Onset More than a month ago   Pain Frequency Intermittent   Aggravating Factors  movement   Pain Relieving Factors heat and hot shower   Multiple Pain Sites No                         [facility] Adult PT Treatment/Exercise - 10/06/14 0001    Self-Care   Self-Care ADL's;Lifting;Posture   ADL's cleaning, standing, sweeping, brush teeth   Lifting lifting son   Posture sitting, sit at computer, stand   Moist Heat Therapy   Number Minutes Moist Heat 20 Minutes   Moist Heat Location Lumbar Spine   Electrical Stimulation   Electrical Stimulation Location lumbar   prone   Electrical Stimulation Action IFC   Electrical Stimulation Parameters to patients tolerance, 20 min prone   Electrical Stimulation Goals Pain  Manual Therapy   Manual Therapy Soft tissue mobilization   Soft tissue mobilization back                PT Education - 10/06/14 1527    Education provided Yes   Education Details body mechanics with daily tasks   Person(s) Educated Patient   Methods Handout;Verbal cues;Demonstration;Explanation   Comprehension Verbalized understanding;Returned demonstration          PT Short Term Goals - 10/05/14 0956    PT SHORT TERM GOAL #1   Title independent with initial HEP   Time 4   Period Weeks   Status Achieved   PT SHORT TERM GOAL #2   Title pain with daily activites decreased >/= 25%   Time 4   Period Weeks   Status Achieved   PT SHORT TERM GOAL #3   Title understand correct body mechanics with daily tasks   Time 4   Period Weeks   Status On-going   PT SHORT  TERM GOAL #4   Title understand how to perform abdominal massage to reduce trigger points in abdomen   Time 4   Period Weeks   Status Achieved           PT Long Term Goals - 09/15/14 0800    PT LONG TERM GOAL #1   Title independent with HEP and understand how to progress herself   Time 12   Period Weeks   Status New   PT LONG TERM GOAL #2   Title understand ways to manage her pain to be able to perform daily activities   Time 12   Period Weeks   Status New   PT LONG TERM GOAL #3   Title pain with taking care of her kids decreased >/= 50%   Time 12   Period Weeks   Status New   PT LONG TERM GOAL #4   Title pain with walking to classes and in the store decreased >/= 50%   Time 12   Period Weeks   Status New   PT LONG TERM GOAL #5   Title abdominal strength >/= 3/5 so patient can lift her 41 year old without straining her back   Time 22   Period Weeks   Status New               Plan - 10/06/14 1528    Clinical Impression Statement Patient is a 39 year old female with chronic low back pain.  Patient is having increase pain today due to her fibromyalgia.  Patient has learned correct body mechanics with daily tasks and lifting her child.  Patient was able to demonstrat correctly.    Pt will benefit from skilled therapeutic intervention in order to improve on the following deficits Decreased activity tolerance;Decreased balance;Decreased mobility;Decreased strength;Postural dysfunction;Impaired flexibility;Pain;Decreased endurance;Increased muscle spasms;Decreased range of motion   Rehab Potential Good   Clinical Impairments Affecting Rehab Potential None   PT Frequency 2x / week   PT Duration 12 weeks   PT Treatment/Interventions ADLs/Self Care Home Management;Cryotherapy;Electrical Stimulation;Moist Heat;Therapeutic exercise;Therapeutic activities;Ultrasound;Neuromuscular re-education;Patient/family education;Manual techniques;Passive range of motion   PT Next Visit  Plan soft tissue work,  e-stim, gentle core strength,     PT Home Exercise Plan progress as needed   Consulted and Agree with Plan of Care Patient        Problem List Patient Active Problem List   Diagnosis Date Noted  . Rigidity of first metatarsophalangeal joint of right foot 09/06/2014  .  Obesity 08/26/2014  . Bilateral low back pain without sciatica 08/09/2014  . Complicated migraine 32/67/1245  . Right leg DVT 08/09/2014  . Anemia due to blood loss, acute 05/03/2014  . Fibromyalgia 07/06/2010    GRAY,CHERYL,PT 10/06/2014, 3:30 PM  [facility] [address] [place], [place], [address] Phone: [phone]   Fax:  [phone]

## 2014-10-06 NOTE — Patient Instructions (Signed)
Sleeping on Side   Place pillow between knees. Use cervical support under neck and a roll around waist as needed.   Copyright  VHI. All rights reserved.  Posture - Sitting  Sit in a chair with armrest.  Sit upright, head facing forward. Try using a roll to support lower back. Keep shoulders relaxed, and avoid rounded back. Keep hips level with knees. Avoid crossing legs for long periods. Keep knees slightly higher than hips.   Copyright  VHI. All rights reserved.  Alternating Positions  Change positions every 30-40 min.  Alternate tasks and change positions frequently to reduce fatigue and muscle tension. Take rest breaks.   Copyright  VHI. All rights reserved.  Computer Work   Position work to Programmer, multimedia. Use proper work and seat height. Keep shoulders back and down, wrists straight, and elbows at right angles. Use chair that provides full back support. Add footrest and lumbar roll as needed.   Copyright  VHI. All rights reserved.  Car   Before driving, adjust seat and steering (if tilt control) to ensure good posture. [place] and a lumbar roll can be used for positioning, whether riding or driving.  Copyright  VHI. All rights reserved.  Standing   For prolonged standing, alternate placing one foot in front of the other or on a stool. Wear low-heeled shoes, and maintain good posture.   Copyright  VHI. All rights reserved.  Neutral   Keep spine in neutral position. Rock pelvis back and forth till you feel least amount of tension in the spine.  Lift chest bone to realign shoulders and head.    Copyright  VHI. All rights reserved.  Childcare - Picking Up from [place] down to pick up baby, and bring close before standing up. Use knees and keep back straight. Engage the abdominals.   Copyright  VHI. All rights reserved.  Childcare - Carrying   Keep baby close and as upright as possible. Keep the abdominals engaged. Keep him close.     Copyright  VHI. All rights reserved.  Housework - Sweeping   Use long-handled equipment to avoid stooping.   Copyright  VHI. All rights reserved.  [place] [address] [place], [place] [address] Phone # [phone] Fax [phone]

## 2014-10-11 ENCOUNTER — Encounter: Payer: Self-pay | Admitting: Physical Therapy

## 2014-10-11 ENCOUNTER — Ambulatory Visit: Payer: BLUE CROSS/BLUE SHIELD | Admitting: Physical Therapy

## 2014-10-11 DIAGNOSIS — M545 Low back pain, unspecified: Secondary | ICD-10-CM

## 2014-10-11 NOTE — Therapy (Signed)
[facility] [address] [place], [place], [address] Phone: [phone]   Fax:  [phone]  Physical Therapy Treatment  Patient Details  Name: Peggy [name] MRN: [id number] Date of Birth: 1975/03/11 Referring Provider:  Silverio Decamp,*  Encounter Date: 10/11/2014      PT End of Session - 10/11/14 1539    Visit Number 8   Date for PT Re-Evaluation 11/17/14   Authorization Type BCBS   Authorization - Visit Number 8   Authorization - Number of Visits 30   PT Start Time 8416   PT Stop Time 1628   PT Time Calculation (min) 50 min   Activity Tolerance Patient tolerated treatment well   Behavior During Therapy [facility] for tasks assessed/performed      Past Medical History  Diagnosis Date  . Fibromyalgia   . Infertility management   . Fibroid uterus   . DVT (deep venous thrombosis)     Past Surgical History  Procedure Laterality Date  . Cesarean section    . Tonsillectomy    . Foot surgery      right foot  . Cardiac catheterization  approx 8 years ago    for chest pain,   . Cesarean section N/A 02/29/2012    Procedure: Repeat CESAREAN SECTION of baby boy at [place] 8/8;  Surgeon: Daria Pastures, MD;  Location: [facility];  Service: Obstetrics;  Laterality: N/A;    There were no vitals filed for this visit.  Visit Diagnosis:  Midline low back pain without sciatica      Subjective Assessment - 10/11/14 1540    Subjective Bend forward better. Back spasms have improved by 45%.    Pertinent History On bed rest for 3 months with pregnancy 12/13- 03/14 before the birth of her son. C-section 03/14 and 2009. six months after first child duagnosed with fibromyalgia and tremors and r/o MS due to lesions on her brain. History of atypical migraines including tremor/ heaviness in back of head/ nauseousness which resloves without medicatioin in 15-20 min. Episodes occur several times/week now. Bone removed from Rt  great toe(metarsal) ~25 years ago has episodic pain in foot.  Coccyx fracture at 39 yr old.   How long can you sit comfortably? 20 min with no wiggles   How long can you stand comfortably? 60 min with movement   How long can you walk comfortably? 60 min   Diagnostic tests xrays   Patient Stated Goals decreased pain in LB replace HNP   Currently in Pain? Yes   Pain Score 5    Pain Location Abdomen   Pain Orientation Lower   Pain Descriptors / Indicators Sharp   Pain Type Chronic pain   Pain Onset More than a month ago   Pain Frequency Intermittent   Aggravating Factors  Not sure   Pain Relieving Factors heat and shower   Multiple Pain Sites No            [facility] PT Assessment - 10/11/14 0001    AROM   Lumbar Extension decreased by 25%   Lumbar - Right Side Bend full   Lumbar - Left Side Bend full   Lumbar - Right Rotation full   Lumbar - Left Rotation full                     [facility] Adult PT Treatment/Exercise - 10/11/14 0001    Lumbar Exercises: Supine   Other Supine Lumbar Exercises  diaphramatic breathing; bilateral shoulder flexion with breathing; PNF bil. no weight 10 x each;    Other Supine Lumbar Exercises sitting alternate shoulder flexion with abdominal bracing;  horizontal abduction with abdomial bracing;    Modalities   Modalities Electrical Stimulation;Moist Heat   Moist Heat Therapy   Number Minutes Moist Heat 20 Minutes   Moist Heat Location Lumbar Spine  prone   Electrical Stimulation   Electrical Stimulation Location lumbar   prone   Electrical Stimulation Action IFC   Electrical Stimulation Parameters to patient tolerance, 20 min   Electrical Stimulation Goals Pain   Manual Therapy   Manual Therapy Soft tissue mobilization;Myofascial release   Soft tissue mobilization abdominal    Myofascial Release one hand on lower abdomen and other on back releasing fascia                  PT Short Term Goals - 10/11/14 1553    PT SHORT TERM  GOAL #3   Title understand correct body mechanics with daily tasks   Time 4   Period Weeks   Status Achieved           PT Long Term Goals - 09/15/14 0800    PT LONG TERM GOAL #1   Title independent with HEP and understand how to progress herself   Time 12   Period Weeks   Status New   PT LONG TERM GOAL #2   Title understand ways to manage her pain to be able to perform daily activities   Time 12   Period Weeks   Status New   PT LONG TERM GOAL #3   Title pain with taking care of her kids decreased >/= 50%   Time 12   Period Weeks   Status New   PT LONG TERM GOAL #4   Title pain with walking to classes and in the store decreased >/= 50%   Time 12   Period Weeks   Status New   PT LONG TERM GOAL #5   Title abdominal strength >/= 3/5 so patient can lift her 41 year old without straining her back   Time 95   Period Weeks   Status New               Plan - 10/11/14 1558    Clinical Impression Statement Patient is a 39 year old female with chronic low back pain.  Patient has met all of her STG's.  Patient is progressing her exercises in sitting but only able to do 3 exercises. . Lumbar ROM full except for extension decreased by 25%. Patient reports her pain is decresaed by 45%. Patient would benefit fromp physical therapy to improve trunk strength and improve pain.    Pt will benefit from skilled therapeutic intervention in order to improve on the following deficits Decreased activity tolerance;Decreased balance;Decreased mobility;Decreased strength;Postural dysfunction;Impaired flexibility;Pain;Decreased endurance;Increased muscle spasms;Decreased range of motion   Clinical Impairments Affecting Rehab Potential None   PT Frequency 2x / week   PT Duration 12 weeks   PT Treatment/Interventions ADLs/Self Care Home Management;Cryotherapy;Electrical Stimulation;Moist Heat;Therapeutic exercise;Therapeutic activities;Ultrasound;Neuromuscular re-education;Patient/family  education;Manual techniques;Passive range of motion   PT Next Visit Plan soft tissue work,  e-stim, gentle core strength in sitting    PT Home Exercise Plan progress as needed   Consulted and Agree with Plan of Care Patient        Problem List Patient Active Problem List   Diagnosis Date Noted  . Rigidity of first metatarsophalangeal  joint of right foot 09/06/2014  . Obesity 08/26/2014  . Bilateral low back pain without sciatica 08/09/2014  . Complicated migraine 34/96/1164  . Right leg DVT 08/09/2014  . Anemia due to blood loss, acute 05/03/2014  . Fibromyalgia 07/06/2010    GRAY,CHERYL,PT 10/11/2014, 4:10 PM  [facility] [address], [place], [place], [address] Phone: [phone]   Fax:  [phone]

## 2014-10-12 ENCOUNTER — Ambulatory Visit: Payer: BLUE CROSS/BLUE SHIELD | Admitting: Physical Therapy

## 2014-10-12 ENCOUNTER — Encounter: Payer: Self-pay | Admitting: Physical Therapy

## 2014-10-12 DIAGNOSIS — M545 Low back pain, unspecified: Secondary | ICD-10-CM

## 2014-10-12 NOTE — Therapy (Signed)
[facility] [address] [place], [place], [address] Phone: [phone]   Fax:  [phone]  Physical Therapy Treatment  Patient Details  Name: Peggy [name] MRN: [id number] Date of Birth: [birth year] Referring Provider:  Silverio Decamp,*  Encounter Date: 10/12/2014      PT End of Session - 10/12/14 0921    Visit Number 9   Date for PT Re-Evaluation 11/17/14   Authorization Type BCBS   Authorization - Visit Number 9   Authorization - Number of Visits 30   PT Start Time 0848   PT Stop Time 0948   PT Time Calculation (min) 60 min   Activity Tolerance Patient tolerated treatment well   Behavior During Therapy [facility] for tasks assessed/performed      Past Medical History  Diagnosis Date  . Fibromyalgia   . Infertility management   . Fibroid uterus   . DVT (deep venous thrombosis)     Past Surgical History  Procedure Laterality Date  . Cesarean section    . Tonsillectomy    . Foot surgery      right foot  . Cardiac catheterization  approx 8 years ago    for chest pain,   . Cesarean section N/A 02/29/2012    Procedure: Repeat CESAREAN SECTION of baby boy at [place] 8/8;  Surgeon: Daria Pastures, MD;  Location: [facility];  Service: Obstetrics;  Laterality: N/A;    There were no vitals filed for this visit.  Visit Diagnosis:  Midline low back pain without sciatica      Subjective Assessment - 10/12/14 0852    Subjective I feel better. My abdomen feels alot better.    Pertinent History On bed rest for 3 months with pregnancy 12/13- 03/14 before the birth of her son. C-section 03/14 and 2009. six months after first child duagnosed with fibromyalgia and tremors and r/o MS due to lesions on her brain. History of atypical migraines including tremor/ heaviness in back of head/ nauseousness which resloves without medicatioin in 15-20 min. Episodes occur several times/week now. Bone removed from Rt great  toe(metarsal) ~25 years ago has episodic pain in foot.  Coccyx fracture at 39 yr old.   How long can you sit comfortably? 20 min with no wiggles   How long can you stand comfortably? 60 min with movement   How long can you walk comfortably? 60 min   Diagnostic tests xrays   Patient Stated Goals decreased pain in LB replace HNP   Currently in Pain? Yes   Pain Score 3    Pain Location Back   Pain Orientation Lower   Pain Descriptors / Indicators Sharp;Dull   Pain Type Chronic pain   Pain Radiating Towards up along spine into left shoulder blade area   Pain Onset More than a month ago   Pain Frequency Intermittent   Aggravating Factors  too much activity   Pain Relieving Factors heat and shower   Multiple Pain Sites No                         [facility] Adult PT Treatment/Exercise - 10/12/14 0001    Lumbar Exercises: Seated   Long Arc Quad on Chair Right;Left;5 reps  with ball squeeze   Sit to Stand 5 reps  vc no posture   Lumbar Exercises: Supine   Clam 5 reps  2 sets   Bent Knee Raise 5 reps  each leg  with abominal bracing   Dead Bug 5 reps  with abdominal bracing   Other Supine Lumbar Exercises alternate shoulder flexion 5 x with abdominal bracing;    Shoulder Exercises: Seated   Horizontal ABduction Both;5 reps   Horizontal ABduction Limitations with ball squeeze   Other Seated Exercises PNF 5 times each way with ball sqeeze   Moist Heat Therapy   Number Minutes Moist Heat 20 Minutes   Moist Heat Location Lumbar Spine  prone   Electrical Stimulation   Electrical Stimulation Location lumbar   prone   Electrical Stimulation Action IFC   Electrical Stimulation Parameters to patient tolerance, 20 min   Electrical Stimulation Goals Pain   Manual Therapy   Manual Therapy Soft tissue mobilization   Soft tissue mobilization lumbar thoracic area in prone                PT Education - 10/12/14 0921    Education provided No          PT Short Term  Goals - 10/11/14 1553    PT SHORT TERM GOAL #3   Title understand correct body mechanics with daily tasks   Time 4   Period Weeks   Status Achieved           PT Long Term Goals - 09/15/14 0800    PT LONG TERM GOAL #1   Title independent with HEP and understand how to progress herself   Time 12   Period Weeks   Status New   PT LONG TERM GOAL #2   Title understand ways to manage her pain to be able to perform daily activities   Time 12   Period Weeks   Status New   PT LONG TERM GOAL #3   Title pain with taking care of her kids decreased >/= 50%   Time 12   Period Weeks   Status New   PT LONG TERM GOAL #4   Title pain with walking to classes and in the store decreased >/= 50%   Time 12   Period Weeks   Status New   PT LONG TERM GOAL #5   Title abdominal strength >/= 3/5 so patient can lift her 41 year old without straining her back   Time 15   Period Weeks   Status New               Plan - 10/12/14 2542    Clinical Impression Statement Patient is a 39 year old female with chronic low back pain. Patient is now performing exercises in sitting.  Patient is now able to perform more UE exercises.  Patient will benefit from physical therapy to improve strength and reduce pain.    Pt will benefit from skilled therapeutic intervention in order to improve on the following deficits Decreased activity tolerance;Decreased balance;Decreased mobility;Decreased strength;Postural dysfunction;Impaired flexibility;Pain;Decreased endurance;Increased muscle spasms;Decreased range of motion   Clinical Impairments Affecting Rehab Potential None   PT Frequency 2x / week   PT Duration 12 weeks   PT Treatment/Interventions ADLs/Self Care Home Management;Cryotherapy;Electrical Stimulation;Moist Heat;Therapeutic exercise;Therapeutic activities;Ultrasound;Neuromuscular re-education;Patient/family education;Manual techniques;Passive range of motion   PT Next Visit Plan soft tissue work,   e-stim, gentle core strength in sitting    PT Home Exercise Plan exercises in sitting   Consulted and Agree with Plan of Care Patient        Problem List Patient Active Problem List   Diagnosis Date Noted  . Rigidity of first metatarsophalangeal joint of right  foot 09/06/2014  . Obesity 08/26/2014  . Bilateral low back pain without sciatica 08/09/2014  . Complicated migraine 74/16/3845  . Right leg DVT 08/09/2014  . Anemia due to blood loss, acute 05/03/2014  . Fibromyalgia 07/06/2010    GRAY,CHERYL,PT 10/12/2014, 9:24 AM  [facility] [address] [place], [place], [address] Phone: [phone]   Fax:  [phone]

## 2014-10-13 ENCOUNTER — Encounter: Payer: BLUE CROSS/BLUE SHIELD | Admitting: Physical Therapy

## 2014-10-18 ENCOUNTER — Ambulatory Visit (INDEPENDENT_AMBULATORY_CARE_PROVIDER_SITE_OTHER): Payer: BLUE CROSS/BLUE SHIELD | Admitting: Family Medicine

## 2014-10-18 ENCOUNTER — Ambulatory Visit: Payer: BLUE CROSS/BLUE SHIELD | Admitting: Physical Therapy

## 2014-10-18 ENCOUNTER — Encounter: Payer: Self-pay | Admitting: Family Medicine

## 2014-10-18 VITALS — BP 100/70 | HR 97 | Ht 67.0 in | Wt 178.0 lb

## 2014-10-18 DIAGNOSIS — I82401 Acute embolism and thrombosis of unspecified deep veins of right lower extremity: Secondary | ICD-10-CM

## 2014-10-18 DIAGNOSIS — E669 Obesity, unspecified: Secondary | ICD-10-CM | POA: Diagnosis not present

## 2014-10-18 NOTE — Progress Notes (Signed)
   Subjective:    Patient ID: Peggy [name], female    DOB: 1975-04-26, 39 y.o.   MRN: [id number]  HPI She is here for a follow-up visit from a DVT that was diagnosed in April and the right calf. She is having no leg pain, chest pain or shortness of breath. She has been having difficulty with foot and back pain. She now is using orthotics and is involved in physical therapy. She also is on phentermine given to her by another physician help with weight loss. She has lost least 12 pounds. Physical therapy and some mobilization has helped with her back discomfort. She is making good progress.   Review of Systems     Objective:   Physical Exam alert and in no distress otherwise not examined      Assessment & Plan:  Obesity  Deep vein thrombosis (DVT) of right lower extremity, unspecified chronicity, unspecified vein (HCC)  Discussed follow-up concerning a DVT and will go ahead and order another ultrasound. Encouraged her to continue with her physical therapy and strengthening as well as weight loss.

## 2014-10-19 ENCOUNTER — Ambulatory Visit: Payer: BLUE CROSS/BLUE SHIELD | Attending: Sports Medicine | Admitting: Physical Therapy

## 2014-10-19 ENCOUNTER — Encounter: Payer: Self-pay | Admitting: Physical Therapy

## 2014-10-19 DIAGNOSIS — M545 Low back pain, unspecified: Secondary | ICD-10-CM

## 2014-10-19 NOTE — Therapy (Signed)
[facility] [address], [place], [place], [address] Phone: [phone]   Fax:  [phone]  Physical Therapy Treatment  Patient Details  Name: Peggy [name] MRN: [id number] Date of Birth: July 31, 1975 Referring Provider:  Silverio Decamp,*  Encounter Date: 10/19/2014      PT End of Session - 10/19/14 0928    Visit Number 10   Date for PT Re-Evaluation 11/17/14   Authorization Type BCBS   Authorization - Visit Number 10   Authorization - Number of Visits 30   PT Start Time 0845   PT Stop Time 0949   PT Time Calculation (min) 64 min   Activity Tolerance Patient tolerated treatment well   Behavior During Therapy [facility] for tasks assessed/performed      Past Medical History  Diagnosis Date  . Fibromyalgia   . Infertility management   . Fibroid uterus   . DVT (deep venous thrombosis) [facility])     Past Surgical History  Procedure Laterality Date  . Cesarean section    . Tonsillectomy    . Foot surgery      right foot  . Cardiac catheterization  approx 8 years ago    for chest pain,   . Cesarean section N/A 02/29/2012    Procedure: Repeat CESAREAN SECTION of baby boy at [place] 8/8;  Surgeon: Daria Pastures, MD;  Location: [facility];  Service: Obstetrics;  Laterality: N/A;    There were no vitals filed for this visit.  Visit Diagnosis:  Midline low back pain without sciatica      Subjective Assessment - 10/19/14 0853    Subjective Standing longer than 10-15 minutes cuases pain in low back and weakness bil LE.    Currently in Pain? Yes   Pain Score 2    Pain Location Back   Pain Orientation Lower   Pain Descriptors / Indicators Sharp;Dull   Pain Type Chronic pain   Pain Onset More than a month ago   Multiple Pain Sites No                         [facility] Adult PT Treatment/Exercise - 10/19/14 0001    Lumbar Exercises: Seated   Long Arc Quad on Chair Right;Left;20 reps   Sit to  Stand 5 reps;Other (comment)  and 5 reps with ball squezzes bil LE & abdominal bracing   Lumbar Exercises: Supine   Clam 5 reps  in sidelying each side   Bent Knee Raise 5 reps  each leg with    Dead Bug 5 reps  with abdominal bracing   Shoulder Exercises: Seated   Horizontal ABduction Both;5 reps   Horizontal ABduction Limitations with ball squeeze BIL LE    Other Seated Exercises PNF 5 times each way with ball sqeeze   Modalities   Modalities Electrical Stimulation;Moist Heat   Moist Heat Therapy   Number Minutes Moist Heat 20 Minutes   Moist Heat Location Lumbar Spine  in supine   Electrical Stimulation   Electrical Stimulation Location lumbar   supine   Electrical Stimulation Action IFC   Electrical Stimulation Parameters to patients tolerance   Electrical Stimulation Goals Pain   Manual Therapy   Manual Therapy Soft tissue mobilization   Soft tissue mobilization lumbar thoracic area in prone                  PT Short Term Goals - 10/19/14 0321  PT SHORT TERM GOAL #1   Title independent with initial HEP   Time 4   Period Weeks   Status Achieved   PT SHORT TERM GOAL #2   Title pain with daily activites decreased >/= 25%   Time 4   Period Weeks   Status Achieved   PT SHORT TERM GOAL #3   Title understand correct body mechanics with daily tasks   Time 4   Period Weeks   Status Achieved   PT SHORT TERM GOAL #4   Title understand how to perform abdominal massage to reduce trigger points in abdomen   Time 4   Period Weeks   Status Achieved           PT Long Term Goals - 10/19/14 8756    PT LONG TERM GOAL #1   Title independent with HEP and understand how to progress herself   Time 12   Period Weeks   Status On-going   PT LONG TERM GOAL #2   Title understand ways to manage her pain to be able to perform daily activities   Time 12   Period Weeks   Status On-going   PT LONG TERM GOAL #3   Title pain with taking care of her kids decreased  >/= 50%   Time 12   Period Weeks   Status On-going   PT LONG TERM GOAL #5   Title abdominal strength >/= 3/5 so patient can lift her 41 year old without straining her back   Time 12   Period Weeks   Status On-going               Plan - 10/19/14 0929    Clinical Impression Statement Pateint is a 39 year old female with chronic low back pain. Patient activity tolerance iimproved as noticed with incr of repetitions with LAQ's and sitting toleraance. Pt wil palpaple tenderness in low bil back. Pateint will continue to benefit friom skiulled PT to improve strength and reduce pain.   Pt will benefit from skilled therapeutic intervention in order to improve on the following deficits Decreased activity tolerance;Decreased balance;Decreased mobility;Decreased strength;Postural dysfunction;Impaired flexibility;Pain;Decreased endurance;Increased muscle spasms;Decreased range of motion   Rehab Potential Good   PT Frequency 2x / week   PT Duration 12 weeks   PT Treatment/Interventions ADLs/Self Care Home Management;Cryotherapy;Electrical Stimulation;Moist Heat;Therapeutic exercise;Therapeutic activities;Ultrasound;Neuromuscular re-education;Patient/family education;Manual techniques;Passive range of motion   PT Next Visit Plan soft tissue work,  e-stim, gentle core strength in sitting    PT Home Exercise Plan continue current exercises   Consulted and Agree with Plan of Care Patient        Problem List Patient Active Problem List   Diagnosis Date Noted  . Rigidity of first metatarsophalangeal joint of right foot 09/06/2014  . Obesity 08/26/2014  . Bilateral low back pain without sciatica 08/09/2014  . Complicated migraine 43/32/9518  . Right leg DVT ([place]) 08/09/2014  . Anemia due to blood loss, acute 05/03/2014  . Fibromyalgia 07/06/2010    NAUMANN-HOUEGNIFIO,Lorielle Boehning PTA 10/19/2014, 9:35 AM  [facility] [address], [place], [place], [address] Phone: [phone]   Fax:  [phone]

## 2014-10-21 ENCOUNTER — Ambulatory Visit (INDEPENDENT_AMBULATORY_CARE_PROVIDER_SITE_OTHER): Payer: BLUE CROSS/BLUE SHIELD | Admitting: Sports Medicine

## 2014-10-21 VITALS — BP 111/80 | HR 97 | Wt 177.0 lb

## 2014-10-21 DIAGNOSIS — E669 Obesity, unspecified: Secondary | ICD-10-CM | POA: Diagnosis not present

## 2014-10-21 MED ORDER — PHENTERMINE HCL 37.5 MG PO TABS: ORAL_TABLET | ORAL | Status: DC

## 2014-10-21 NOTE — Assessment & Plan Note (Signed)
Fantastic weight loss after the first month, 3 pounds after the last month, refilling phentermine as we enter the third month, continue Topamax.  Return in one month.

## 2014-10-21 NOTE — Progress Notes (Signed)
  Subjective:    CC: Weight check  HPI: Excellent weight loss after the first month on phentermine, had a bit of decrease in physical activity after the most recent month and has only lost 3 pounds. Only side effect is dry mouth, I did caution her about the risk of caries if she didn't keep her mouth moisturized.  Past medical history, Surgical history, Family history not pertinant except as noted below, Social history, Allergies, and medications have been entered into the medical record, reviewed, and no changes needed.   Review of Systems: No fevers, chills, night sweats, weight loss, chest pain, or shortness of breath.   Objective:    General: Well Developed, well nourished, and in no acute distress.  Neuro: Alert and oriented x3, extra-ocular muscles intact, sensation grossly intact.  HEENT: Normocephalic, atraumatic, pupils equal round reactive to light, neck supple, no masses, no lymphadenopathy, thyroid nonpalpable.  Skin: Warm and dry, no rashes. Cardiac: Regular rate and rhythm, no murmurs rubs or gallops, no lower extremity edema.  Respiratory: Clear to auscultation bilaterally. Not using accessory muscles, speaking in full sentences.  Impression and Recommendations:

## 2014-10-24 ENCOUNTER — Encounter: Payer: Self-pay | Admitting: Family Medicine

## 2014-10-24 ENCOUNTER — Ambulatory Visit (INDEPENDENT_AMBULATORY_CARE_PROVIDER_SITE_OTHER): Payer: BLUE CROSS/BLUE SHIELD | Admitting: Family Medicine

## 2014-10-24 VITALS — BP 112/72 | HR 64 | Temp 98.2°F | Wt 179.6 lb

## 2014-10-24 DIAGNOSIS — A084 Viral intestinal infection, unspecified: Secondary | ICD-10-CM | POA: Diagnosis not present

## 2014-10-24 DIAGNOSIS — R109 Unspecified abdominal pain: Secondary | ICD-10-CM

## 2014-10-24 NOTE — Progress Notes (Signed)
   Subjective:    Patient ID: Peggy [name], female    DOB: 01-09-1976, 39 y.o.   MRN: [id number]  HPI Chief Complaint  Patient presents with  . sick    chills, headaches, throwing up, diarrhea- last epsiode was about an hour ago. not taken anything yet except hot tea and honey    She is here for less than a 24 hour history of illness. She states she had a sore throat yesterday afternoon along with a headache and started vomiting last night. Reports she had approximately 8 episodes of vomiting and then diarrhea started last night and she had about 8 episodes of watery brown stools with the last episode about one hour ago. Denies blood or pus in stool. States she continues to have a sore throat and sore abdomen, headache has improved.  She states she would like to rule out pregnancy even though her husband has had a vasectomy.  No known sick contacts. No recent travel or eating out. No recent antibiotic use or hospitalizations.  Has not taken any over-the-counter medications.She just stopped taking Xerelto last week and was taking this due to blood clot.   Reviewed allergies, medications, past medical and social history.    Past Medical History  Diagnosis Date  . Fibromyalgia   . Infertility management   . Fibroid uterus   . DVT (deep venous thrombosis) (HCC)      Review of Systems Pertinent positives and negatives in the history of present illness.    Objective:   Physical Exam  Constitutional: She is oriented to person, place, and time. She appears well-developed and well-nourished. No distress.  HENT:  Right Ear: External ear normal.  Left Ear: External ear normal.  Nose: Nose normal.  Mouth/Throat: Oropharynx is clear and moist. No oropharyngeal exudate.  Neck: Normal range of motion. Neck supple.  Cardiovascular: Normal rate, regular rhythm and normal heart sounds.   Pulmonary/Chest: Effort normal and breath sounds normal.  Abdominal: Soft. Bowel sounds are normal. She  exhibits no distension. There is no hepatosplenomegaly. There is tenderness. There is no rebound, no guarding, no CVA tenderness, no tenderness at McBurney's point and negative Murphy's sign.  Diffuse tenderness, greater to lower quadrants without rebound or referred.   Lymphadenopathy:    She has no cervical adenopathy.  Neurological: She is alert and oriented to person, place, and time.  Skin: Skin is warm and dry. No rash noted. No cyanosis. No pallor.  Psychiatric: She has a normal mood and affect. Her speech is normal and behavior is normal. Thought content normal.   BP 112/72 mmHg  Pulse 64  Temp(Src) 98.2 F (36.8 C) (Oral)  Wt 179 lb 9.6 oz (81.466 kg)      Assessment & Plan:  Viral gastroenteritis  Abdominal pain in female patient - Plan: POCT urine pregnancy  Suspect that her symptoms are due to a viral etiology. Discussed staying hydrated and gauging this by keeping an eye on her urine color and frequency. Recommend symptomatic treatment such as Tylenol for aches and pains, Zofran, which she has at home for nausea and over the counter pepto bismol if diarrhea persists and avoiding dairy and other offending foods. She will let me know if she gets worse or is not feeling better in the next 48 hours.

## 2014-10-24 NOTE — Patient Instructions (Signed)
Take Zofran for nausea and try liquids today. The key is staying hydrating.  If you develop high fever or worsening symptoms call us. If you are not improving in 48 hours let us know.     Viral Gastroenteritis Viral gastroenteritis is also known as stomach flu. This condition affects the stomach and intestinal tract. It can cause sudden diarrhea and vomiting. The illness typically lasts 3 to 8 days. Most people develop an immune response that eventually gets rid of the virus. While this natural response develops, the virus can make you quite ill. CAUSES  Many different viruses can cause gastroenteritis, such as rotavirus or noroviruses. You can catch one of these viruses by consuming contaminated food or water. You may also catch a virus by sharing utensils or other personal items with an infected person or by touching a contaminated surface. SYMPTOMS  The most common symptoms are diarrhea and vomiting. These problems can cause a severe loss of body fluids (dehydration) and a body salt (electrolyte) imbalance. Other symptoms may include:  Fever.  Headache.  Fatigue.  Abdominal pain. DIAGNOSIS  Your caregiver can usually diagnose viral gastroenteritis based on your symptoms and a physical exam. A stool sample may also be taken to test for the presence of viruses or other infections. TREATMENT  This illness typically goes away on its own. Treatments are aimed at rehydration. The most serious cases of viral gastroenteritis involve vomiting so severely that you are not able to keep fluids down. In these cases, fluids must be given through an intravenous line (IV). HOME CARE INSTRUCTIONS   Drink enough fluids to keep your urine clear or pale yellow. Drink small amounts of fluids frequently and increase the amounts as tolerated.  Ask your caregiver for specific rehydration instructions.  Avoid:  Foods high in sugar.  Alcohol.  Carbonated drinks.  Tobacco.  Juice.  Caffeine  drinks.  Extremely hot or cold fluids.  Fatty, greasy foods.  Too much intake of anything at one time.  Dairy products until 24 to 48 hours after diarrhea stops.  You may consume probiotics. Probiotics are active cultures of beneficial bacteria. They may lessen the amount and number of diarrheal stools in adults. Probiotics can be found in yogurt with active cultures and in supplements.  Wash your hands well to avoid spreading the virus.  Only take over-the-counter or prescription medicines for pain, discomfort, or fever as directed by your caregiver. Do not give aspirin to children. Antidiarrheal medicines are not recommended.  Ask your caregiver if you should continue to take your regular prescribed and over-the-counter medicines.  Keep all follow-up appointments as directed by your caregiver. SEEK IMMEDIATE MEDICAL CARE IF:   You are unable to keep fluids down.  You do not urinate at least once every 6 to 8 hours.  You develop shortness of breath.  You notice blood in your stool or vomit. This may look like coffee grounds.  You have abdominal pain that increases or is concentrated in one small area (localized).  You have persistent vomiting or diarrhea.  You have a fever.  The patient is a child younger than 3 months, and he or she has a fever.  The patient is a child older than 3 months, and he or she has a fever and persistent symptoms.  The patient is a child older than 3 months, and he or she has a fever and symptoms suddenly get worse.  The patient is a baby, and he or she has no tears  when crying. MAKE SURE YOU:   Understand these instructions.  Will watch your condition.  Will get help right away if you are not doing well or get worse.   This information is not intended to replace advice given to you by your health care provider. Make sure you discuss any questions you have with your health care provider.   Document Released: 12/31/2004 Document Revised:  03/25/2011 Document Reviewed: 10/17/2010 Elsevier Interactive Patient Education [employer].

## 2014-10-25 ENCOUNTER — Ambulatory Visit: Payer: BLUE CROSS/BLUE SHIELD | Admitting: Physical Therapy

## 2014-10-27 ENCOUNTER — Ambulatory Visit: Payer: BLUE CROSS/BLUE SHIELD | Admitting: Physical Therapy

## 2014-10-27 ENCOUNTER — Encounter: Payer: Self-pay | Admitting: Physical Therapy

## 2014-10-27 DIAGNOSIS — M545 Low back pain, unspecified: Secondary | ICD-10-CM

## 2014-10-27 NOTE — Therapy (Signed)
[facility] [address] [place], [place], [address] Phone: [phone]   Fax:  [phone]  Physical Therapy Treatment  Patient Details  Name: Peggy [name] MRN: [id number] Date of Birth: 02/09/1975 Referring Provider:  Silverio Decamp,*  Encounter Date: 10/27/2014      PT End of Session - 10/27/14 0918    Visit Number 11   Date for PT Re-Evaluation 11/17/14   Authorization - Number of Visits 30   PT Start Time 0848   PT Stop Time 0952   PT Time Calculation (min) 64 min   Activity Tolerance Patient tolerated treatment well   Behavior During Therapy [facility] for tasks assessed/performed      Past Medical History  Diagnosis Date  . Fibromyalgia   . Infertility management   . Fibroid uterus   . DVT (deep venous thrombosis) [facility])     Past Surgical History  Procedure Laterality Date  . Cesarean section    . Tonsillectomy    . Foot surgery      right foot  . Cardiac catheterization  approx 8 years ago    for chest pain,   . Cesarean section N/A 02/29/2012    Procedure: Repeat CESAREAN SECTION of baby boy at [place] 8/8;  Surgeon: Daria Pastures, MD;  Location: [facility];  Service: Obstetrics;  Laterality: N/A;    There were no vitals filed for this visit.  Visit Diagnosis:  Midline low back pain without sciatica      Subjective Assessment - 10/27/14 0852    Subjective Pt reports feels very sore the last 8 day's, but noticed improvement with standing tolerance up to 16min now.   Pertinent History On bed rest for 3 months with pregnancy 12/13- 03/14 before the birth of her son. C-section 03/14 and 2009. six months after first child duagnosed with fibromyalgia and tremors and r/o MS due to lesions on her brain. History of atypical migraines including tremor/ heaviness in back of head/ nauseousness which resloves without medicatioin in 15-20 min. Episodes occur several times/week now. Bone removed from Rt  great toe(metarsal) ~25 years ago has episodic pain in foot.  Coccyx fracture at 39 yr old.   Currently in Pain? Yes   Pain Score 4    Pain Location Back   Pain Orientation Lower   Pain Descriptors / Indicators Dull   Pain Type Chronic pain   Pain Onset More than a month ago   Multiple Pain Sites No                         [facility] Adult PT Treatment/Exercise - 10/27/14 0001    Lumbar Exercises: Stretches   Lower Trunk Rotation 3 reps;20 seconds  with bolster on each side   Lumbar Exercises: Seated   Long Arc Quad on Chair Right;Left;20 reps   LAQ on Chair Weights (lbs) --  2# added , 3 x 10   Sit to Stand 5 reps;Other (comment)  ball squezzes with focus on abdominal bracing   Lumbar Exercises: Supine   Clam 5 reps   x 3 in sidelying each side   Bent Knee Raise --   Dead Bug 5 reps   Modalities   Modalities Electrical Stimulation;Moist Heat   Moist Heat Therapy   Number Minutes Moist Heat 20 Minutes   Moist Heat Location Lumbar Spine  in supine   Electrical Stimulation   Electrical Stimulation Location lumbar  in supine   Electrical Stimulation Action IFC   Electrical Stimulation Parameters to patient tolerance   Electrical Stimulation Goals Pain   Manual Therapy   Manual Therapy Soft tissue mobilization   Soft tissue mobilization lumbar thoracic area in prone                  PT Short Term Goals - 10/19/14 0931    PT SHORT TERM GOAL #1   Title independent with initial HEP   Time 4   Period Weeks   Status Achieved   PT SHORT TERM GOAL #2   Title pain with daily activites decreased >/= 25%   Time 4   Period Weeks   Status Achieved   PT SHORT TERM GOAL #3   Title understand correct body mechanics with daily tasks   Time 4   Period Weeks   Status Achieved   PT SHORT TERM GOAL #4   Title understand how to perform abdominal massage to reduce trigger points in abdomen   Time 4   Period Weeks   Status Achieved           PT Long  Term Goals - 10/27/14 1349    PT LONG TERM GOAL #1   Title independent with HEP and understand how to progress herself   Time 12   Period Weeks   Status On-going   PT LONG TERM GOAL #2   Title understand ways to manage her pain to be able to perform daily activities   Time 12   Period Weeks   Status On-going   PT LONG TERM GOAL #3   Title pain with taking care of her kids decreased >/= 50%   Time 12   Period Weeks   Status On-going   PT LONG TERM GOAL #4   Title pain with walking to classes and in the store decreased >/= 50%   Time 12   Period Weeks   Status On-going   PT LONG TERM GOAL #5   Title abdominal strength >/= 3/5 so patient can lift her 41 year old without straining her back   Time 12   Period Weeks   Status On-going               Plan - 10/27/14 0934    Clinical Impression Statement Patient with increased soreness in mid back, she was able to tolerate gentle stretching and strengthening exercises. Pt will continue to benefit from skilled PT to improve strength and reduce pain.   Pt will benefit from skilled therapeutic intervention in order to improve on the following deficits Decreased activity tolerance;Decreased balance;Decreased mobility;Decreased strength;Postural dysfunction;Impaired flexibility;Pain;Decreased endurance;Increased muscle spasms;Decreased range of motion   Rehab Potential Good   PT Frequency 2x / week   PT Duration 12 weeks   PT Treatment/Interventions ADLs/Self Care Home Management;Cryotherapy;Electrical Stimulation;Moist Heat;Therapeutic exercise;Therapeutic activities;Ultrasound;Neuromuscular re-education;Patient/family education;Manual techniques;Passive range of motion   PT Next Visit Plan soft tissue work,  e-stim, gentle core strength in sitting    PT Home Exercise Plan continue current exercises   Consulted and Agree with Plan of Care Patient        Problem List Patient Active Problem List   Diagnosis Date Noted  . Rigidity  of first metatarsophalangeal joint of right foot 09/06/2014  . Obesity 08/26/2014  . Bilateral low back pain without sciatica 08/09/2014  . Complicated migraine 16/10/9602  . Right leg DVT ([place]) 08/09/2014  . Anemia due to blood loss, acute 05/03/2014  . Fibromyalgia  07/06/2010    NAUMANN-HOUEGNIFIO,Beronica Lansdale PTA 10/27/2014, 1:51 PM  [facility] [address], [place], [place], [address] Phone: [phone]   Fax:  [phone]

## 2014-10-31 ENCOUNTER — Encounter: Payer: Self-pay | Admitting: Physical Therapy

## 2014-10-31 ENCOUNTER — Ambulatory Visit: Payer: BLUE CROSS/BLUE SHIELD | Admitting: Physical Therapy

## 2014-10-31 DIAGNOSIS — M545 Low back pain, unspecified: Secondary | ICD-10-CM

## 2014-10-31 NOTE — Therapy (Signed)
[facility] [address], [place], [place], [address] Phone: [phone]   Fax:  [phone]  Physical Therapy Treatment  Patient Details  Name: Peggy [name] MRN: [id number] Date of Birth: August 12, 1975 No Data Recorded  Encounter Date: 10/31/2014      PT End of Session - 10/31/14 0858    Visit Number 12   Date for PT Re-Evaluation 11/17/14   Authorization Type [employer]   Authorization - Visit Number 12   Authorization - Number of Visits 30   PT Start Time 9798   PT Stop Time 0945   PT Time Calculation (min) 50 min   Activity Tolerance Patient tolerated treatment well   Behavior During Therapy [facility] for tasks assessed/performed      Past Medical History  Diagnosis Date  . Fibromyalgia   . Infertility management   . Fibroid uterus   . DVT (deep venous thrombosis) [facility])     Past Surgical History  Procedure Laterality Date  . Cesarean section    . Tonsillectomy    . Foot surgery      right foot  . Cardiac catheterization  approx 8 years ago    for chest pain,   . Cesarean section N/A 02/29/2012    Procedure: Repeat CESAREAN SECTION of baby boy at [place] 8/8;  Surgeon: Daria Pastures, MD;  Location: [facility];  Service: Obstetrics;  Laterality: N/A;    There were no vitals filed for this visit.  Visit Diagnosis:  Midline low back pain without sciatica      Subjective Assessment - 10/31/14 0859    Subjective Patient reports she is 55% better.     How long can you sit comfortably? 20 min.    How long can you stand comfortably? 90 min   How long can you walk comfortably? 90 min   Diagnostic tests xrays   Patient Stated Goals decreased pain in LB replace HNP   Currently in Pain? Yes   Pain Score 2    Pain Location Back   Pain Orientation Lower   Pain Descriptors / Indicators Aching   Pain Type Chronic pain   Pain Onset More than a month ago   Pain Frequency Intermittent   Aggravating Factors   sleeping on couch   Pain Relieving Factors heat shower   Multiple Pain Sites No            [facility] PT Assessment - 10/31/14 0001    AROM   Lumbar Extension full                     [facility] Adult PT Treatment/Exercise - 10/31/14 0001    Lumbar Exercises: Standing   Other Standing Lumbar Exercises lumbar extension 10x   Other Standing Lumbar Exercises alternate shoulder flexion 1# 10x with abdominal bracing   Lumbar Exercises: Seated   Long Arc Quad on Chair Right;Left;20 reps   LAQ on Chair Weights (lbs) --  2# added , 3 x 10   Sit to Stand Limitations sit an punck 1# weight forward 10x each with correct posture; bil. shoulder stretch to pull arm down and across; bil. sidebend stretch with holding arms; arms back and pull down and back   Shoulder Exercises: Seated   Abduction Both;10 reps;Weights  90 degree to up   ABduction Weight (lbs) 1   Modalities   Modalities Electrical Stimulation;Moist Heat   Moist Heat Therapy   Number Minutes Moist Heat 15 Minutes  Moist Heat Location Lumbar Spine  in supine   Electrical Stimulation   Electrical Stimulation Location lumbar   in supine   Electrical Stimulation Action IFC   Electrical Stimulation Parameters to patient tolerance, 15 min   Electrical Stimulation Goals Pain                  PT Short Term Goals - 10/19/14 0931    PT SHORT TERM GOAL #1   Title independent with initial HEP   Time 4   Period Weeks   Status Achieved   PT SHORT TERM GOAL #2   Title pain with daily activites decreased >/= 25%   Time 4   Period Weeks   Status Achieved   PT SHORT TERM GOAL #3   Title understand correct body mechanics with daily tasks   Time 4   Period Weeks   Status Achieved   PT SHORT TERM GOAL #4   Title understand how to perform abdominal massage to reduce trigger points in abdomen   Time 4   Period Weeks   Status Achieved           PT Long Term Goals - 10/31/14 9326    PT LONG TERM GOAL #1    Title independent with HEP and understand how to progress herself   Time 12   Period Weeks   Status On-going  still learning   PT LONG TERM GOAL #2   Title understand ways to manage her pain to be able to perform daily activities   Time 12   Period Weeks   Status Achieved   PT LONG TERM GOAL #3   Title pain with taking care of her kids decreased >/= 50%   Time 12   Period Weeks   Status On-going   PT LONG TERM GOAL #4   Title pain with walking to classes and in the store decreased >/= 50%   Time 12   Period Weeks   Status Achieved  60% better   PT LONG TERM GOAL #5   Title abdominal strength >/= 3/5 so patient can lift her 41 year old without straining her back   Time 12   Period Weeks   Status On-going  2/5               Plan - 10/31/14 0908    Clinical Impression Statement Patient is a 39 year old patient with chronic low back pain. Patient was 10 min late. Patient can walk in the store with 60% less back pain. Pateint can stand and walk for 90 min.  Paitent now has full lumbar extension. Patieint reports abdominal pain decreased by 30%.  Patient is now able to do core strengthening in sitting.  Patient has met LTG # 3.  Patient wil lbenefit form physical therapy to increase core strength.    Pt will benefit from skilled therapeutic intervention in order to improve on the following deficits Decreased activity tolerance;Decreased balance;Decreased mobility;Decreased strength;Postural dysfunction;Impaired flexibility;Pain;Decreased endurance;Increased muscle spasms;Decreased range of motion   Rehab Potential Good   Clinical Impairments Affecting Rehab Potential None   PT Frequency 2x / week   PT Duration 12 weeks   PT Treatment/Interventions ADLs/Self Care Home Management;Cryotherapy;Electrical Stimulation;Moist Heat;Therapeutic exercise;Therapeutic activities;Ultrasound;Neuromuscular re-education;Patient/family education;Manual techniques;Passive range of motion   PT Next  Visit Plan soft tissue work,  e-stim, gentle core strength in sitting    PT Home Exercise Plan continue current exercises   Consulted and Agree with Plan of Care Patient  Problem List Patient Active Problem List   Diagnosis Date Noted  . Rigidity of first metatarsophalangeal joint of right foot 09/06/2014  . Obesity 08/26/2014  . Bilateral low back pain without sciatica 08/09/2014  . Complicated migraine 75/88/3254  . Right leg DVT ([place]) 08/09/2014  . Anemia due to blood loss, acute 05/03/2014  . Fibromyalgia 07/06/2010    Jaquese Irving,PT 10/31/2014, 9:28 AM  [facility] [address] [place], [place], [address] Phone: [phone]   Fax:  [phone]  Name: Peggy [name] MRN: [id number] Date of Birth: [birth year]

## 2014-11-02 ENCOUNTER — Ambulatory Visit: Payer: BLUE CROSS/BLUE SHIELD | Admitting: Physical Therapy

## 2014-11-02 ENCOUNTER — Encounter: Payer: Self-pay | Admitting: Physical Therapy

## 2014-11-02 DIAGNOSIS — M545 Low back pain, unspecified: Secondary | ICD-10-CM

## 2014-11-02 NOTE — Therapy (Signed)
[facility] [address], [place], [place], [address] Phone: [phone]   Fax:  [phone]  Physical Therapy Treatment  Patient Details  Name: ALONDRA VANDEVEN MRN: [id number] Date of Birth: Oct 23, 1975 No Data Recorded  Encounter Date: 11/02/2014      PT End of Session - 11/02/14 0906    Visit Number 13   Date for PT Re-Evaluation 11/17/14   PT Start Time 0850   PT Stop Time 0946   PT Time Calculation (min) 56 min   Activity Tolerance Patient tolerated treatment well   Behavior During Therapy [facility] for tasks assessed/performed      Past Medical History  Diagnosis Date  . Fibromyalgia   . Infertility management   . Fibroid uterus   . DVT (deep venous thrombosis) [facility])     Past Surgical History  Procedure Laterality Date  . Cesarean section    . Tonsillectomy    . Foot surgery      right foot  . Cardiac catheterization  approx 8 years ago    for chest pain,   . Cesarean section N/A 02/29/2012    Procedure: Repeat CESAREAN SECTION of baby boy at [place] 8/8;  Surgeon: Daria Pastures, MD;  Location: [place] [facility];  Service: Obstetrics;  Laterality: N/A;    There were no vitals filed for this visit.  Visit Diagnosis:  Midline low back pain without sciatica      Subjective Assessment - 11/02/14 0852    Subjective Patient complains of low back pain on left rated as 4/10   Currently in Pain? Yes   Pain Score 4    Pain Location Back   Pain Orientation Lower   Pain Descriptors / Indicators Aching   Pain Type Chronic pain   Pain Onset More than a month ago   Pain Frequency Intermittent                         [facility] Adult PT Treatment/Exercise - 11/02/14 0001    Lumbar Exercises: Stretches   Single Knee to Chest Stretch 3 reps;20 seconds   Lower Trunk Rotation 3 reps;20 seconds   Standing Extension 2 reps  McKenzie extension  2 x 10   Lumbar Exercises: Standing   Other Standing Lumbar  Exercises lumbar extension 10x   Other Standing Lumbar Exercises alternate shoulder flexion 1# 10x with abdominal bracing   Shoulder Exercises: Seated   Other Seated Exercises PNF 2x5 times each way with yellow weight ball 2000ge   Other Seated Exercises sitting abdominals with yellow  weight ball 2000g, small motions to left and right    Modalities   Modalities Electrical Stimulation;Moist Heat   Moist Heat Therapy   Number Minutes Moist Heat 15 Minutes   Moist Heat Location Lumbar Spine   Electrical Stimulation   Electrical Stimulation Location lumbar    Electrical Stimulation Action IFC   Electrical Stimulation Parameters to patient tolerance   Electrical Stimulation Goals Pain   Manual Therapy   Manual Therapy Soft tissue mobilization   Soft tissue mobilization lumbar thoracic area in prone                  PT Short Term Goals - 10/19/14 0931    PT SHORT TERM GOAL #1   Title independent with initial HEP   Time 4   Period Weeks   Status Achieved   PT SHORT TERM GOAL #2   Title  pain with daily activites decreased >/= 25%   Time 4   Period Weeks   Status Achieved   PT SHORT TERM GOAL #3   Title understand correct body mechanics with daily tasks   Time 4   Period Weeks   Status Achieved   PT SHORT TERM GOAL #4   Title understand how to perform abdominal massage to reduce trigger points in abdomen   Time 4   Period Weeks   Status Achieved           PT Long Term Goals - 10/31/14 9509    PT LONG TERM GOAL #1   Title independent with HEP and understand how to progress herself   Time 12   Period Weeks   Status On-going  still learning   PT LONG TERM GOAL #2   Title understand ways to manage her pain to be able to perform daily activities   Time 12   Period Weeks   Status Achieved   PT LONG TERM GOAL #3   Title pain with taking care of her kids decreased >/= 50%   Time 12   Period Weeks   Status On-going   PT LONG TERM GOAL #4   Title pain with  walking to classes and in the store decreased >/= 50%   Time 12   Period Weeks   Status Achieved  60% better   PT LONG TERM GOAL #5   Title abdominal strength >/= 3/5 so patient can lift her 41 year old without straining her back   Time 12   Period Weeks   Status On-going  2/5               Plan - 11/02/14 0907    Clinical Impression Statement Patient is a 39 y.o. patient with chronic low back pain. Patient reports was able to walk yesterday for 1.5 hours what is a great improvement. Pt will benefit from physical therapy to increase core strength and control pain.   Pt will benefit from skilled therapeutic intervention in order to improve on the following deficits Decreased activity tolerance;Decreased balance;Decreased mobility;Decreased strength;Postural dysfunction;Impaired flexibility;Pain;Decreased endurance;Increased muscle spasms;Decreased range of motion   Rehab Potential Good   PT Frequency 2x / week   PT Duration 12 weeks   PT Treatment/Interventions ADLs/Self Care Home Management;Cryotherapy;Electrical Stimulation;Moist Heat;Therapeutic exercise;Therapeutic activities;Ultrasound;Neuromuscular re-education;Patient/family education;Manual techniques;Passive range of motion   PT Next Visit Plan soft tissue work,  e-stim, gentle core strength in sitting    PT Home Exercise Plan continue current exercises   Consulted and Agree with Plan of Care Patient        Problem List Patient Active Problem List   Diagnosis Date Noted  . Rigidity of first metatarsophalangeal joint of right foot 09/06/2014  . Obesity 08/26/2014  . Bilateral low back pain without sciatica 08/09/2014  . Complicated migraine 32/67/1245  . Right leg DVT ([place]) 08/09/2014  . Anemia due to blood loss, acute 05/03/2014  . Fibromyalgia 07/06/2010    NAUMANN-HOUEGNIFIO,Janene Yousuf PTA 11/02/2014, 9:33 AM  [facility] [address] [place], [place], [address] Phone: [phone]   Fax:  [phone]  Name: DANETTE WEINFELD MRN: [id number] Date of Birth: June 18, 1975

## 2014-11-03 ENCOUNTER — Telehealth: Payer: Self-pay | Admitting: Family Medicine

## 2014-11-03 NOTE — Telephone Encounter (Signed)
Called to inform pt left message about the u/s that ins. Will not pay for a follow up on her dvt that she would have to pay out of pocket if she wanted to do this then please call me back

## 2014-11-03 NOTE — Telephone Encounter (Signed)
Dr.Lalonde I just tried to scheduled  The U/S the U/S people said the way it is worded she will have to pay for the U/S could you please reorder test reason other than follow up is she still having issues if she still has pain ins. Will pay for it

## 2014-11-03 NOTE — Telephone Encounter (Signed)
I asked Cheri about this and she said for you to check 10/18/14, that you had ordered an [place] for her. I can check into it if you want me to but I saw her for a URI. Let me now.

## 2014-11-03 NOTE — Telephone Encounter (Signed)
Pt called and was seen 2 weeks ago and was suppose to get a referral to see about her blood clot, and said no one has called her about it, and she was wondering about it,

## 2014-11-03 NOTE — Telephone Encounter (Signed)
This one is yours

## 2014-11-07 ENCOUNTER — Ambulatory Visit: Payer: BLUE CROSS/BLUE SHIELD | Admitting: Physical Therapy

## 2014-11-07 ENCOUNTER — Encounter: Payer: Self-pay | Admitting: Physical Therapy

## 2014-11-07 DIAGNOSIS — M545 Low back pain, unspecified: Secondary | ICD-10-CM

## 2014-11-07 NOTE — Therapy (Signed)
[facility] [address] [place], [place], [address] Phone: [phone]   Fax:  [phone]  Physical Therapy Treatment  Patient Details  Name: Peggy [name] MRN: [id number] Date of Birth: 06/09/1975 Referring Provider: Dr. Aundria Mems  Encounter Date: 11/07/2014      PT End of Session - 11/07/14 0914    Visit Number 14   Date for PT Re-Evaluation 11/17/14   Authorization Type BCBS   Authorization - Visit Number 14   PT Start Time 0845   PT Stop Time 0935   PT Time Calculation (min) 50 min   Activity Tolerance Patient tolerated treatment well   Behavior During Therapy [facility] for tasks assessed/performed      Past Medical History  Diagnosis Date  . Fibromyalgia   . Infertility management   . Fibroid uterus   . DVT (deep venous thrombosis) [facility])     Past Surgical History  Procedure Laterality Date  . Cesarean section    . Tonsillectomy    . Foot surgery      right foot  . Cardiac catheterization  approx 8 years ago    for chest pain,   . Cesarean section N/A 02/29/2012    Procedure: Repeat CESAREAN SECTION of baby boy at [place] 8/8;  Surgeon: Daria Pastures, MD;  Location: [place] [facility];  Service: Obstetrics;  Laterality: N/A;    There were no vitals filed for this visit.  Visit Diagnosis:  Midline low back pain without sciatica      Subjective Assessment - 11/07/14 0851    Subjective I am stiff and sore in my shoulders from doing yoga yesterday. Friday I wil lfind out when I am having a hysterectomy.  No back pain.    How long can you sit comfortably? 30 min   How long can you stand comfortably? 90 min   How long can you walk comfortably? 90 min   Patient Stated Goals decreased pain in LB replace HNP   Currently in Pain? No/denies            [facility] PT Assessment - 11/07/14 0001    Assessment   Referring Provider Dr. Aundria Mems   Onset Date/Surgical Date 06/30/14   Hand  Dominance Right   Next MD Visit 08/26/14                     [facility] Adult PT Treatment/Exercise - 11/07/14 0001    Lumbar Exercises: Standing   Shoulder ADduction Left;Right;10 reps;Other (comment)  1# alternating; 2 sets   Other Standing Lumbar Exercises alternate shoulder flexion 1# 10x with abdominal bracing   Lumbar Exercises: Seated   Long Arc Quad on Chair Right;Left;20 reps   LAQ on Chair Weights (lbs) 3   LAQ on Chair Limitations abdominal bracing   Sit to Stand 5 reps  2 sets holding 3 pounds, VC on leg position   Modalities   Modalities Electrical Stimulation;Moist Heat   Moist Heat Therapy   Number Minutes Moist Heat 15 Minutes   Moist Heat Location Lumbar Spine   Electrical Stimulation   Electrical Stimulation Location lumbar    Electrical Stimulation Action IFC   Electrical Stimulation Parameters to patient tolerance, 15 min   Electrical Stimulation Goals Pain                PT Education - 11/07/14 0914    Education provided No          PT  Short Term Goals - 10/19/14 0931    PT SHORT TERM GOAL #1   Title independent with initial HEP   Time 4   Period Weeks   Status Achieved   PT SHORT TERM GOAL #2   Title pain with daily activites decreased >/= 25%   Time 4   Period Weeks   Status Achieved   PT SHORT TERM GOAL #3   Title understand correct body mechanics with daily tasks   Time 4   Period Weeks   Status Achieved   PT SHORT TERM GOAL #4   Title understand how to perform abdominal massage to reduce trigger points in abdomen   Time 4   Period Weeks   Status Achieved           PT Long Term Goals - 11/07/14 0277    PT LONG TERM GOAL #1   Title independent with HEP and understand how to progress herself   Time 12   Period Weeks   Status On-going   PT LONG TERM GOAL #2   Title understand ways to manage her pain to be able to perform daily activities   Time 12   Period Weeks   Status Achieved   PT LONG TERM GOAL #3    Title pain with taking care of her kids decreased >/= 50%   Time 12   Period Weeks   Status Achieved  65% better   PT LONG TERM GOAL #4   Title pain with walking to classes and in the store decreased >/= 50%   Time 12   Period Weeks   Status Achieved  60% better   PT LONG TERM GOAL #5   Title abdominal strength >/= 3/5 so patient can lift her 40 year old without straining her back   Time 12   Period Weeks   Status On-going               Plan - 11/07/14 0914    Clinical Impression Statement Patient is a 39 year old female with chronic low back pain.  Patient reports her lumbar pain has decreased by 65% with taking care  of her kids.  Patient reports she is now abel to stand and walk for 90 min and sit for 30 min.  Patient is now able to exercise with increased weights.  Patient will benefit from phsycia therapy to improve core strength.    Pt will benefit from skilled therapeutic intervention in order to improve on the following deficits Decreased activity tolerance;Decreased balance;Decreased mobility;Decreased strength;Postural dysfunction;Impaired flexibility;Pain;Decreased endurance;Increased muscle spasms;Decreased range of motion   Rehab Potential Good   Clinical Impairments Affecting Rehab Potential None   PT Frequency 2x / week   PT Duration 12 weeks   PT Treatment/Interventions ADLs/Self Care Home Management;Cryotherapy;Electrical Stimulation;Moist Heat;Therapeutic exercise;Therapeutic activities;Ultrasound;Neuromuscular re-education;Patient/family education;Manual techniques;Passive range of motion   PT Next Visit Plan soft tissue work,  e-stim, gentle core strength in sitting    PT Home Exercise Plan continue current exercises   Consulted and Agree with Plan of Care Patient        Problem List Patient Active Problem List   Diagnosis Date Noted  . Rigidity of first metatarsophalangeal joint of right foot 09/06/2014  . Obesity 08/26/2014  . Bilateral low back  pain without sciatica 08/09/2014  . Complicated migraine 41/28/7867  . Right leg DVT ([place]) 08/09/2014  . Anemia due to blood loss, acute 05/03/2014  . Fibromyalgia 07/06/2010    GRAY,CHERYL,PT 11/07/2014, 9:17 AM  [facility] [address] [place], [place], [address] Phone: [phone]   Fax:  [phone]  Name: Peggy [name] MRN: [id number] Date of Birth: April 16, 1975

## 2014-11-08 ENCOUNTER — Other Ambulatory Visit: Payer: Self-pay | Admitting: Sports Medicine

## 2014-11-08 DIAGNOSIS — G43109 Migraine with aura, not intractable, without status migrainosus: Secondary | ICD-10-CM

## 2014-11-08 MED ORDER — TOPIRAMATE 100 MG PO TABS: 100.0000 mg | ORAL_TABLET | Freq: Every day | ORAL | Status: DC

## 2014-11-09 ENCOUNTER — Ambulatory Visit: Payer: BLUE CROSS/BLUE SHIELD | Admitting: Physical Therapy

## 2014-11-14 ENCOUNTER — Ambulatory Visit: Payer: Self-pay | Admitting: Physical Therapy

## 2014-11-16 ENCOUNTER — Ambulatory Visit: Payer: BLUE CROSS/BLUE SHIELD | Attending: Sports Medicine

## 2014-11-16 DIAGNOSIS — M545 Low back pain, unspecified: Secondary | ICD-10-CM

## 2014-11-16 NOTE — Therapy (Signed)
[facility] [address], [address] [place], [place], [address] Phone: [phone]   Fax:  [phone]  Physical Therapy Treatment  Patient Details  Name: Peggy [name] MRN: [id number] Date of Birth: 09-03-1975 Referring Provider: Dr. Rodney Langton  Encounter Date: 11/16/2014      PT End of Session - 11/16/14 0920    Visit Number 15   PT Start Time 0851   PT Stop Time 0935   PT Time Calculation (min) 44 min   Activity Tolerance Patient tolerated treatment well   Behavior During Therapy [facility] for tasks assessed/performed      Past Medical History  Diagnosis Date  . Fibromyalgia   . Infertility management   . Fibroid uterus   . DVT (deep venous thrombosis) [facility])     Past Surgical History  Procedure Laterality Date  . Cesarean section    . Tonsillectomy    . Foot surgery      right foot  . Cardiac catheterization  approx 8 years ago    for chest pain,   . Cesarean section N/A 02/29/2012    Procedure: Repeat CESAREAN SECTION of baby boy at [address] 8/8;  Surgeon: Loney Laurence, MD;  Location: [facility];  Service: Obstetrics;  Laterality: N/A;    There were no vitals filed for this visit.  Visit Diagnosis:  Midline low back pain without sciatica      Subjective Assessment - 11/16/14 0857    Subjective Sore after a weekend of travel.  Pt reports 75% overall improvement since the start of care.     Currently in Pain? Yes   Pain Score 4   after travel   Pain Location Back   Pain Orientation Lower   Pain Descriptors / Indicators Aching;Dull   Pain Type Chronic pain   Pain Onset More than a month ago   Pain Frequency Constant   Aggravating Factors  being active    Pain Relieving Factors heat, stretching            [facility] PT Assessment - 11/16/14 0001    Assessment   Medical Diagnosis low back pain   Onset Date/Surgical Date 06/30/14   Observation/Other Assessments   Focus on Therapeutic Outcomes  (FOTO)  41% limitation                     [facility] Adult PT Treatment/Exercise - 11/16/14 0001    Lumbar Exercises: Stretches   Active Hamstring Stretch 3 reps;20 seconds   Single Knee to Chest Stretch 3 reps;20 seconds   Lower Trunk Rotation 3 reps;20 seconds   Lumbar Exercises: Seated   Long Arc Quad on Chair Right;Left;20 reps   LAQ on Chair Weights (lbs) 3   LAQ on Chair Limitations abdominal bracing   Moist Heat Therapy   Number Minutes Moist Heat 15 Minutes   Moist Heat Location Lumbar Spine   Electrical Stimulation   Electrical Stimulation Location lumbar    Electrical Stimulation Action IFC   Electrical Stimulation Parameters 15 minutes   Electrical Stimulation Goals Pain                  PT Short Term Goals - 10/19/14 0931    PT SHORT TERM GOAL #1   Title independent with initial HEP   Time 4   Period Weeks   Status Achieved   PT SHORT TERM GOAL #2   Title pain with daily activites decreased >/= 25%  Time 4   Period Weeks   Status Achieved   PT SHORT TERM GOAL #3   Title understand correct body mechanics with daily tasks   Time 4   Period Weeks   Status Achieved   PT SHORT TERM GOAL #4   Title understand how to perform abdominal massage to reduce trigger points in abdomen   Time 4   Period Weeks   Status Achieved           PT Long Term Goals - 11/16/14 1443    PT LONG TERM GOAL #1   Title independent with HEP and understand how to progress herself   Status Achieved   PT LONG TERM GOAL #2   Title understand ways to manage her pain to be able to perform daily activities   Status Achieved   PT LONG TERM GOAL #3   Title pain with taking care of her kids decreased >/= 50%   Status Achieved   PT LONG TERM GOAL #4   Title pain with walking to classes and in the store decreased >/= 50%   Status Achieved   PT LONG TERM GOAL #5   Title abdominal strength >/= 3/5 so patient can lift her 41 year old without straining her back   Status  Partially Met  still with weak abdominals and strain with lifting child               Plan - 11/16/14 0908    Clinical Impression Statement Pt reports 75% overall improvement since the start of care. Pt has met all goals and will be discharged to HEP with emphasis on core strength.  Pt will consider pilates in the future.     PT Next Visit Plan D/C PT to HEP   Consulted and Agree with Plan of Care Patient        Problem List Patient Active Problem List   Diagnosis Date Noted  . Rigidity of first metatarsophalangeal joint of right foot 09/06/2014  . Obesity 08/26/2014  . Bilateral low back pain without sciatica 08/09/2014  . Complicated migraine 15/40/0867  . Right leg DVT ([place]) 08/09/2014  . Anemia due to blood loss, acute 05/03/2014  . Fibromyalgia 07/06/2010  PHYSICAL THERAPY DISCHARGE SUMMARY  Visits from Start of Care: 15  Current functional level related to goals / functional outcomes: Pt attended 15 PT sessions for gentle core strength and advancement of activity.  Pt reports 75% overall improvement since the start of care and will D/C to HEP.   Remaining deficits: Pt with continued weak abdominals and will continue to work on progression of this strength.  Pt will consider treatment for weak pelvic floor.   Education / Equipment: HEP, Economist education Plan: Patient agrees to discharge.  Patient goals were partially met. Patient is being discharged due to being pleased with the current functional level.  ?????      Camry Theiss, PT 11/16/2014, 9:21 AM  [facility] [address] [place], [place], [address] Phone: [phone]   Fax:  [phone]  Name: Peggy [name] MRN: [id number] Date of Birth: Jul 29, 1975

## 2014-11-18 ENCOUNTER — Encounter: Payer: Self-pay | Admitting: Sports Medicine

## 2014-11-18 ENCOUNTER — Ambulatory Visit (INDEPENDENT_AMBULATORY_CARE_PROVIDER_SITE_OTHER): Payer: BLUE CROSS/BLUE SHIELD | Admitting: Sports Medicine

## 2014-11-18 DIAGNOSIS — E669 Obesity, unspecified: Secondary | ICD-10-CM | POA: Diagnosis not present

## 2014-11-18 MED ORDER — PHENTERMINE HCL 37.5 MG PO TABS: ORAL_TABLET | ORAL | Status: DC

## 2014-11-18 MED ORDER — LIRAGLUTIDE -WEIGHT MANAGEMENT 18 MG/3ML ~~LOC~~ SOPN
3.0000 mg | PEN_INJECTOR | Freq: Every day | SUBCUTANEOUS | Status: DC
Start: 2014-11-18 — End: 2014-12-23

## 2014-11-18 NOTE — Progress Notes (Signed)
  Subjective:    CC: Follow-up  HPI: Obesity: 2 pound weight loss, she is on Megace for menorrhagia by her OB/GYN, hysterectomy is scheduled for next year, I have asked her to discuss possibly switching to Provera with OB/GYN.  Past medical history, Surgical history, Family history not pertinant except as noted below, Social history, Allergies, and medications have been entered into the medical record, reviewed, and no changes needed.   Review of Systems: No fevers, chills, night sweats, weight loss, chest pain, or shortness of breath.   Objective:    General: Well Developed, well nourished, and in no acute distress.  Neuro: Alert and oriented x3, extra-ocular muscles intact, sensation grossly intact.  HEENT: Normocephalic, atraumatic, pupils equal round reactive to light, neck supple, no masses, no lymphadenopathy, thyroid nonpalpable.  Skin: Warm and dry, no rashes. Cardiac: Regular rate and rhythm, no murmurs rubs or gallops, no lower extremity edema.  Respiratory: Clear to auscultation bilaterally. Not using accessory muscles, speaking in full sentences.  Impression and Recommendations:    I spent 25 minutes with this patient, greater than 50% was face-to-face time counseling regarding the above diagnoses

## 2014-11-18 NOTE — Assessment & Plan Note (Signed)
2. Weight loss after the third month on phentermine, there is a confounding factor, she is on Megace for intractable menorrhagia, hysterectomy is scheduled for early next year. When she comes off of Megace weight loss will be profound. In the meantime we will continue phentermine, Topamax and add Saxenda, she will return for nurse visit to learn injections.

## 2014-12-07 ENCOUNTER — Telehealth: Payer: Self-pay | Admitting: Sports Medicine

## 2014-12-07 NOTE — Telephone Encounter (Signed)
Received fax for prior authorization on Saxenda sent through cover my meds waiting on authorization. - CF °

## 2014-12-14 NOTE — Telephone Encounter (Signed)
Received fax from [facility] and they denied coverage on Saxenda due to BMI needs to be 30 or more and initial BMI of 27 or more when other weight related issues are present. - CF

## 2014-12-16 ENCOUNTER — Ambulatory Visit: Payer: BLUE CROSS/BLUE SHIELD | Admitting: Sports Medicine

## 2014-12-16 ENCOUNTER — Other Ambulatory Visit: Payer: Self-pay | Admitting: Sports Medicine

## 2014-12-16 ENCOUNTER — Telehealth: Payer: Self-pay | Admitting: Sports Medicine

## 2014-12-16 NOTE — Telephone Encounter (Signed)
[employer] denied Saxenda, needs to fail both Qsymia AND Belviq unfortunately. BMI>27 with MTP arthritis and lumbar DDD present.

## 2014-12-23 ENCOUNTER — Encounter: Payer: Self-pay | Admitting: Sports Medicine

## 2014-12-23 ENCOUNTER — Ambulatory Visit (INDEPENDENT_AMBULATORY_CARE_PROVIDER_SITE_OTHER): Payer: BLUE CROSS/BLUE SHIELD | Admitting: Sports Medicine

## 2014-12-23 DIAGNOSIS — E669 Obesity, unspecified: Secondary | ICD-10-CM | POA: Diagnosis not present

## 2014-12-23 MED ORDER — LIRAGLUTIDE 18 MG/3ML ~~LOC~~ SOPN
PEN_INJECTOR | SUBCUTANEOUS | Status: DC
Start: 2014-12-23 — End: 2015-05-23

## 2014-12-23 MED ORDER — PHENTERMINE HCL 37.5 MG PO TABS: ORAL_TABLET | ORAL | Status: DC

## 2014-12-23 NOTE — Progress Notes (Signed)
  Subjective:    CC: Weight check  HPI: Obesity: 4 pound additional weight loss after 4 full months of phentermine, Saxenda was not approved. Continues with Topamax.  Past medical history, Surgical history, Family history not pertinant except as noted below, Social history, Allergies, and medications have been entered into the medical record, reviewed, and no changes needed.   Review of Systems: No fevers, chills, night sweats, weight loss, chest pain, or shortness of breath.   Objective:    General: Well Developed, well nourished, and in no acute distress.  Neuro: Alert and oriented x3, extra-ocular muscles intact, sensation grossly intact.  HEENT: Normocephalic, atraumatic, pupils equal round reactive to light, neck supple, no masses, no lymphadenopathy, thyroid nonpalpable.  Skin: Warm and dry, no rashes. Cardiac: Regular rate and rhythm, no murmurs rubs or gallops, no lower extremity edema.  Respiratory: Clear to auscultation bilaterally. Not using accessory muscles, speaking in full sentences.  Impression and Recommendations:

## 2014-12-23 NOTE — Assessment & Plan Note (Signed)
Continue phentermine, Topamax. 3 additional pound weight loss after 4 full months of phentermine. Peggy [name] was not approved, we are going to try Victoza.

## 2015-01-15 HISTORY — PX: ABDOMINAL HYSTERECTOMY: SHX81

## 2015-01-20 ENCOUNTER — Encounter: Payer: Self-pay | Admitting: Sports Medicine

## 2015-01-20 ENCOUNTER — Ambulatory Visit (INDEPENDENT_AMBULATORY_CARE_PROVIDER_SITE_OTHER): Payer: BLUE CROSS/BLUE SHIELD | Admitting: Sports Medicine

## 2015-01-20 VITALS — BP 140/91 | HR 91 | Temp 98.1°F | Resp 18 | Wt 176.8 lb

## 2015-01-20 DIAGNOSIS — R079 Chest pain, unspecified: Secondary | ICD-10-CM | POA: Diagnosis not present

## 2015-01-20 DIAGNOSIS — D62 Acute posthemorrhagic anemia: Secondary | ICD-10-CM | POA: Diagnosis not present

## 2015-01-20 DIAGNOSIS — Z23 Encounter for immunization: Secondary | ICD-10-CM | POA: Diagnosis not present

## 2015-01-20 DIAGNOSIS — E669 Obesity, unspecified: Secondary | ICD-10-CM | POA: Diagnosis not present

## 2015-01-20 LAB — COMPREHENSIVE METABOLIC PANEL
ALT: 15 U/L (ref 6–29)
Albumin: 4.3 g/dL (ref 3.6–5.1)
Alkaline Phosphatase: 49 U/L (ref 33–115)
CO2: 24 mmol/L (ref 20–31)
Calcium: 9.3 mg/dL (ref 8.6–10.2)
Chloride: 105 mmol/L (ref 98–110)
Creat: 0.8 mg/dL (ref 0.50–1.10)
Glucose, Bld: 85 mg/dL (ref 65–99)
Sodium: 138 mmol/L (ref 135–146)
Total Bilirubin: 0.5 mg/dL (ref 0.2–1.2)
Total Protein: 7.4 g/dL (ref 6.1–8.1)

## 2015-01-20 LAB — CBC
HCT: 38.9 % (ref 36.0–46.0)
Hemoglobin: 12.4 g/dL (ref 12.0–15.0)
MCH: 21.5 pg — ABNORMAL LOW (ref 26.0–34.0)
MCHC: 31.9 g/dL (ref 30.0–36.0)
MCV: 67.3 fL — ABNORMAL LOW (ref 78.0–100.0)
MPV: 9.6 fL (ref 8.6–12.4)
Platelets: 364 K/uL (ref 150–400)
RBC: 5.78 MIL/uL — ABNORMAL HIGH (ref 3.87–5.11)
RDW: 19.1 % — ABNORMAL HIGH (ref 11.5–15.5)
WBC: 9.3 K/uL (ref 4.0–10.5)

## 2015-01-20 LAB — HEMOGLOBIN A1C
Hgb A1c MFr Bld: 6 % — ABNORMAL HIGH (ref <5.7)
Mean Plasma Glucose: 126 mg/dL — ABNORMAL HIGH (ref <117)

## 2015-01-20 LAB — LIPID PANEL
Cholesterol: 194 mg/dL (ref 125–200)
HDL: 35 mg/dL — ABNORMAL LOW (ref >=46)
LDL Cholesterol: 138 mg/dL — ABNORMAL HIGH (ref <130)
Total CHOL/HDL Ratio: 5.5 Ratio — ABNORMAL HIGH (ref <=5.0)
Triglycerides: 107 mg/dL (ref <150)
VLDL: 21 mg/dL (ref <30)

## 2015-01-20 LAB — COMPREHENSIVE METABOLIC PANEL WITH GFR
AST: 15 U/L (ref 10–30)
BUN: 12 mg/dL (ref 7–25)
Potassium: 4.1 mmol/L (ref 3.5–5.3)

## 2015-01-20 LAB — TSH: TSH: 2.472 u[IU]/mL (ref 0.350–4.500)

## 2015-01-20 MED ORDER — MEDROXYPROGESTERONE ACETATE 10 MG PO TABS: 10.0000 mg | ORAL_TABLET | Freq: Every day | ORAL | Status: DC

## 2015-01-20 NOTE — Progress Notes (Signed)
  Subjective:    CC:  Follow-up  HPI: Obesity: Gain some weight, she did self discontinue phentermine secondary to chest pain.  Chest pain: Was substernal, short-lived, no radiation, no nausea, diaphoresis, palpitations. Does have a history of chest pain in the past with a positive stress test that led to a negative catheterization. This was about 20 years ago.  Hyperlipidemia: tells me her lipids have been very elevated past but has not had a check in over 2 years.  Menorrhagia: Hysterectomy is scheduled for April, currently on Megace , combined oral contraceptives have been ineffective, understands that Megace cause significant weight gain and is agreeable to proceed with Provera alone for a while.  Past medical history, Surgical history, Family history not pertinant except as noted below, Social history, Allergies, and medications have been entered into the medical record, reviewed, and no changes needed.   Review of Systems: No fevers, chills, night sweats, weight loss, chest pain, or shortness of breath.   Objective:    General: Well Developed, well nourished, and in no acute distress.  Neuro: Alert and oriented x3, extra-ocular muscles intact, sensation grossly intact.  HEENT: Normocephalic, atraumatic, pupils equal round reactive to light, neck supple, no masses, no lymphadenopathy, thyroid nonpalpable.  Skin: Warm and dry, no rashes. Cardiac: Regular rate and rhythm, no murmurs rubs or gallops, no lower extremity edema.  Respiratory: Clear to auscultation bilaterally. Not using accessory muscles, speaking in full sentences.  Impression and Recommendations:

## 2015-01-20 NOTE — Assessment & Plan Note (Signed)
Has since discontinued phentermine, did have a cardiac catheterization about 20 years ago that was essentially clear, only minimal 80% blockage. At this point I do think we need to risk stratify her, checking lipids, A1c, as well as an going to obtain a treadmill stress test. If all is negative we can restart phentermine.

## 2015-01-20 NOTE — Assessment & Plan Note (Signed)
Had some chest pain with phentermine, this is unlikely anginal however we are going to do a workup before continuing. She can continue Victoza and Topamax for now.

## 2015-01-20 NOTE — Assessment & Plan Note (Signed)
Hysterectomy is scheduled for April. She is on Megace which is sabotaging her weight loss. Hasn't had a good response to standard oral contraceptives in the past, we are going to try to switch to Provera for Megace.

## 2015-02-17 ENCOUNTER — Ambulatory Visit: Payer: BLUE CROSS/BLUE SHIELD | Admitting: Sports Medicine

## 2015-02-24 ENCOUNTER — Ambulatory Visit: Payer: BLUE CROSS/BLUE SHIELD | Admitting: Sports Medicine

## 2015-05-19 ENCOUNTER — Other Ambulatory Visit: Payer: Self-pay

## 2015-05-19 ENCOUNTER — Ambulatory Visit (INDEPENDENT_AMBULATORY_CARE_PROVIDER_SITE_OTHER): Payer: BLUE CROSS/BLUE SHIELD | Admitting: Family Medicine

## 2015-05-19 ENCOUNTER — Encounter: Payer: Self-pay | Admitting: Family Medicine

## 2015-05-19 VITALS — BP 122/76 | HR 97 | Wt 185.0 lb

## 2015-05-19 DIAGNOSIS — R0683 Snoring: Secondary | ICD-10-CM

## 2015-05-19 DIAGNOSIS — J301 Allergic rhinitis due to pollen: Secondary | ICD-10-CM

## 2015-05-19 MED ORDER — MONTELUKAST SODIUM 10 MG PO TABS: 10.0000 mg | ORAL_TABLET | Freq: Every day | ORAL | Status: DC

## 2015-05-19 NOTE — Progress Notes (Signed)
   Subjective:    Patient ID: Peggy [name], female    DOB: 12-04-1975, 40 y.o.   MRN: [id number]  HPI She is here for consult concerning snoring. She has snored her whole life but it is getting worse. She and her husband are now sleeping in separate bedrooms because of this. She also has seasonal allergies with sneezing, itchy watery eyes and rhinorrhea as well as popping sensation especially in the right ear. She presently is on Zyrtec.   Review of Systems     Objective:   Physical Exam Right TM does show fluid behind it, left is normal. Nasal mucosa normal. Throat clear. Neck is supple without adenopathy. Epworth Sleepiness Scale of 14      Assessment & Plan:  Snoring - Plan: Nocturnal polysomnography  Allergic rhinitis due to pollen - Plan: montelukast (SINGULAIR) 10 MG tablet discuss snoring in regard to her allergies as well as sleep apnea. Her both. She is to use Zyrtec, Flonase and Singulair. I will also arrange for her to get a sleep study. At the end of the encounter she then mentioned back discomfort as well as right foot pain. She has had her right first MTP joint injected. She thinks that she is walking abnormally and therefore causing some back pain. I recommended that she return to Dr. Virgilio Belling for reevaluation and possible injection.

## 2015-05-23 ENCOUNTER — Encounter: Payer: Self-pay | Admitting: Sports Medicine

## 2015-05-23 ENCOUNTER — Ambulatory Visit (INDEPENDENT_AMBULATORY_CARE_PROVIDER_SITE_OTHER): Payer: BLUE CROSS/BLUE SHIELD | Admitting: Sports Medicine

## 2015-05-23 VITALS — BP 137/87 | HR 93 | Resp 18 | Wt 186.2 lb

## 2015-05-23 DIAGNOSIS — M25674 Stiffness of right foot, not elsewhere classified: Secondary | ICD-10-CM

## 2015-05-23 DIAGNOSIS — M2021 Hallux rigidus, right foot: Secondary | ICD-10-CM

## 2015-05-23 NOTE — Progress Notes (Signed)
  Subjective:    CC: Right foot pain  HPI: This is a pleasant 40 year old female with mild right first MTP arthritis, she had an injection approximately 9 months ago, fantastic response and desires repeat, pain is recurrent, moderate, persistent without radiation. No trauma.  Past medical history, Surgical history, Family history not pertinant except as noted below, Social history, Allergies, and medications have been entered into the medical record, reviewed, and no changes needed.   Review of Systems: No fevers, chills, night sweats, weight loss, chest pain, or shortness of breath.   Objective:    General: Well Developed, well nourished, and in no acute distress.  Neuro: Alert and oriented x3, extra-ocular muscles intact, sensation grossly intact.  HEENT: Normocephalic, atraumatic, pupils equal round reactive to light, neck supple, no masses, no lymphadenopathy, thyroid nonpalpable.  Skin: Warm and dry, no rashes. Cardiac: Regular rate and rhythm, no murmurs rubs or gallops, no lower extremity edema.  Respiratory: Clear to auscultation bilaterally. Not using accessory muscles, speaking in full sentences.  Procedure: Real-time Ultrasound Guided Injection of right first MTP Device: GE Logiq E  Verbal informed consent obtained.  Time-out conducted.  Noted no overlying erythema, induration, or other signs of local infection.  Skin prepped in a sterile fashion.  Local anesthesia: Topical Ethyl chloride.  With sterile technique and under real time ultrasound guidance:  30-gauge needle advanced into the MTP and 1/2 mL lidocaine, 1/2 mL kenalog 40 injected easily. Completed without difficulty  Pain immediately resolved suggesting accurate placement of the medication.  Advised to call if fevers/chills, erythema, induration, drainage, or persistent bleeding.  Images permanently stored and available for review in the ultrasound unit.  Impression: Technically successful ultrasound guided  injection.  Impression and Recommendations:

## 2015-05-23 NOTE — Assessment & Plan Note (Signed)
Right first MTP injection as above, nine-month response to previous injection, return as needed.

## 2015-06-13 ENCOUNTER — Encounter: Payer: Self-pay | Admitting: Family Medicine

## 2015-06-13 ENCOUNTER — Ambulatory Visit (INDEPENDENT_AMBULATORY_CARE_PROVIDER_SITE_OTHER): Payer: BLUE CROSS/BLUE SHIELD | Admitting: Family Medicine

## 2015-06-13 VITALS — BP 120/70 | HR 78 | Ht 67.0 in | Wt 186.0 lb

## 2015-06-13 DIAGNOSIS — R0683 Snoring: Secondary | ICD-10-CM

## 2015-06-13 DIAGNOSIS — J301 Allergic rhinitis due to pollen: Secondary | ICD-10-CM

## 2015-06-13 NOTE — Progress Notes (Signed)
   Subjective:    Patient ID: Peggy [name], female    DOB: [birth year], 40 y.o.   MRN: [id number]  HPI  She is here for a recheck. She is now using Flonase and Singulair and having no difficulty with this. Her snoring has improved greatly. She is having some slight rhinorrhea.  Review of Systems     Objective:   Physical Exam and in no distress.      Assessment & Plan:  Snoring  Allergic rhinitis due to pollen, unspecified rhinitis seasonality Recommend that she add Zyrtec back to her regimen and continue on her others. She was happy with the progress that she made.

## 2015-07-31 ENCOUNTER — Encounter: Payer: Self-pay | Admitting: Family Medicine

## 2015-07-31 ENCOUNTER — Ambulatory Visit (INDEPENDENT_AMBULATORY_CARE_PROVIDER_SITE_OTHER): Payer: BLUE CROSS/BLUE SHIELD | Admitting: Family Medicine

## 2015-07-31 VITALS — BP 130/80 | HR 64 | Temp 98.2°F | Wt 190.4 lb

## 2015-07-31 DIAGNOSIS — L259 Unspecified contact dermatitis, unspecified cause: Secondary | ICD-10-CM | POA: Diagnosis not present

## 2015-07-31 MED ORDER — PREDNISONE 10 MG (21) PO TBPK: 10.0000 mg | ORAL_TABLET | Freq: Every day | ORAL | Status: DC

## 2015-07-31 NOTE — Patient Instructions (Addendum)
Domeboro soaks to right wrist.  Complete the steroid packet.  Try to avoid scratching the area to prevent a secondary bacterial infection.  Take Benadryl for itching as needed.  Use cool compresses. aveeno oatmeal bath is also ok.  Try to limit the spread and call if the area worsens or becomes infected as we discussed.   Contact Dermatitis Dermatitis is redness, soreness, and swelling (inflammation) of the skin. Contact dermatitis is a reaction to certain substances that touch the skin. There are two types of contact dermatitis:   Irritant contact dermatitis. This type is caused by something that irritates your skin, such as dry hands from washing them too much. This type does not require previous exposure to the substance for a reaction to occur. This type is more common.  Allergic contact dermatitis. This type is caused by a substance that you are allergic to, such as a nickel allergy or poison ivy. This type only occurs if you have been exposed to the substance (allergen) before. Upon a repeat exposure, your body reacts to the substance. This type is less common. CAUSES  Many different substances can cause contact dermatitis. Irritant contact dermatitis is most commonly caused by exposure to:   Makeup.   Soaps.   Detergents.   Bleaches.   Acids.   Metal salts, such as nickel.  Allergic contact dermatitis is most commonly caused by exposure to:   Poisonous plants.   Chemicals.   Jewelry.   Latex.   Medicines.   Preservatives in products, such as clothing.  RISK FACTORS This condition is more likely to develop in:   People who have jobs that expose them to irritants or allergens.  People who have certain medical conditions, such as asthma or eczema.  SYMPTOMS  Symptoms of this condition may occur anywhere on your body where the irritant has touched you or is touched by you. Symptoms include:  Dryness or flaking.   Redness.   Cracks.   Itching.    Pain or a burning feeling.   Blisters.  Drainage of small amounts of blood or clear fluid from skin cracks. With allergic contact dermatitis, there may also be swelling in areas such as the eyelids, mouth, or genitals.  DIAGNOSIS  This condition is diagnosed with a medical history and physical exam. A patch skin test may be performed to help determine the cause. If the condition is related to your job, you may need to see an occupational medicine specialist. TREATMENT Treatment for this condition includes figuring out what caused the reaction and protecting your skin from further contact. Treatment may also include:   Steroid creams or ointments. Oral steroid medicines may be needed in more severe cases.  Antibiotics or antibacterial ointments, if a skin infection is present.  Antihistamine lotion or an antihistamine taken by mouth to ease itching.  A bandage (dressing). HOME CARE INSTRUCTIONS Skin Care  Moisturize your skin as needed.   Apply cool compresses to the affected areas.  Try taking a bath with:  Epsom salts. Follow the instructions on the packaging. You can get these at your local pharmacy or grocery store.  Baking soda. Pour a small amount into the bath as directed by your health care provider.  Colloidal oatmeal. Follow the instructions on the packaging. You can get this at your local pharmacy or grocery store.  Try applying baking soda paste to your skin. Stir water into baking soda until it reaches a paste-like consistency.  Do not scratch your skin.  Bathe less frequently, such as every other day.  Bathe in lukewarm water. Avoid using hot water. Medicines  Take or apply over-the-counter and prescription medicines only as told by your health care provider.   If you were prescribed an antibiotic medicine, take or apply your antibiotic as told by your health care provider. Do not stop using the antibiotic even if your condition starts to  improve. General Instructions  Keep all follow-up visits as told by your health care provider. This is important.  Avoid the substance that caused your reaction. If you do not know what caused it, keep a journal to try to track what caused it. Write down:  What you eat.  What cosmetic products you use.  What you drink.  What you wear in the affected area. This includes jewelry.  If you were given a dressing, take care of it as told by your health care provider. This includes when to change and remove it. SEEK MEDICAL CARE IF:   Your condition does not improve with treatment.  Your condition gets worse.  You have signs of infection such as swelling, tenderness, redness, soreness, or warmth in the affected area.  You have a fever.  You have new symptoms. SEEK IMMEDIATE MEDICAL CARE IF:   You have a severe headache, neck pain, or neck stiffness.  You vomit.  You feel very sleepy.  You notice red streaks coming from the affected area.  Your bone or joint underneath the affected area becomes painful after the skin has healed.  The affected area turns darker.  You have difficulty breathing.   This information is not intended to replace advice given to you by your health care provider. Make sure you discuss any questions you have with your health care provider.   Document Released: 12/29/1999 Document Revised: 09/21/2014 Document Reviewed: 05/18/2014 Elsevier Interactive Patient Education [employer].

## 2015-07-31 NOTE — Progress Notes (Signed)
   Subjective:    Patient ID: Peggy [name], female    DOB: 1975/11/01, 40 y.o.   MRN: [id number]  HPI Chief Complaint  Patient presents with  . rash    posion ivy or sumac- thursday on wrist and thigh.    She is here with a 5 day history of pruritic rash and blisters to her right wrist and forearm, left wrist and forearm and right inner thigh and lower leg. Onset after working in her yard. Thinks she was exposed to poison sumac. She states she had long sleeves on that day which is consistent with her rash. Denies rash to palms, soles, torso. Reports history of allergy to poison ivy and sumac.   Denies fever, chills, headache, neck pain, nausea, vomiting, or diarrhea. No oral lesions or rash to face.    Review of Systems Pertinent positives and negatives in the history of present illness.     Objective:   Physical Exam BP 130/80 mmHg  Pulse 64  Temp(Src) 98.2 F (36.8 C) (Oral)  Wt 190 lb 6.4 oz (86.365 kg)  Alert and oriented and in no acute distress. Oropharyngeal exam normal. Right wrist with a patchy erythematous rash with blisters and clear oozing, no induration or fluctuance. . Left wrist with a few mild discrete erythematous raised bumps, no surrounding erythema or drainage.   Right leg with diffuse discrete erythematous raises rash in a somewhat linear rash consistent with a poison ivy rash.       Assessment & Plan:  Contact dermatitis - Plan: predniSONE (STERAPRED UNI-PAK 21 TAB) 10 MG (21) TBPK tablet  Steroid Dosepak prescribed. She denies any problems with taking steroids in the past. Recommend trying Domeboro solution OTC for the oozing areas.  Advised to use cool compresses and taking cool showers to help with itching.  Also discussed taking Benadryl at night as needed for itching. Discussed importance of limiting spread of the rash and advised to avoid scratching the areas and prevent a secondary Bacterial infection. She will call/return if she notices signs of  infections or if rash continues to spread or worsen.

## 2015-09-18 ENCOUNTER — Other Ambulatory Visit: Payer: Self-pay | Admitting: Obstetrics and Gynecology

## 2015-09-28 NOTE — Patient Instructions (Signed)
Your procedure is scheduled on:  Wednesday, Sept. 27, 2017  Enter through the [employer] [facility] at:  6:00 AM  Pick up the phone at the desk and dial [phone].  Call this number if you have problems the morning of surgery: [phone].  Remember: Do NOT eat food or drink after:  Midnight Tuesday Sept. 26, 2017  Take these medicines the morning of surgery with a SIP OF WATER:  None  Do NOT wear jewelry (body piercing), metal hair clips/bobby pins, make-up, or nail polish. Do NOT wear lotions, powders, or perfumes.  You may wear deodorant. Do NOT shave for 48 hours prior to surgery. Do NOT bring valuables to the hospital. Contacts, dentures, or bridgework may not be worn into surgery.  Leave suitcase in car.  After surgery it may be brought to your room.  For patients admitted to the hospital, checkout time is 11:00 AM the day of discharge.

## 2015-09-29 ENCOUNTER — Inpatient Hospital Stay (HOSPITAL_COMMUNITY)
Admission: RE | Admit: 2015-09-29 | Discharge: 2015-09-29 | Disposition: A | Discharge status: Home / Self Care | Payer: BLUE CROSS/BLUE SHIELD | Source: Ambulatory Visit

## 2015-10-04 ENCOUNTER — Encounter (HOSPITAL_COMMUNITY): Payer: Self-pay

## 2015-10-04 ENCOUNTER — Encounter (HOSPITAL_COMMUNITY)
Admission: RE | Admit: 2015-10-04 | Discharge: 2015-10-04 | Disposition: A | Discharge status: Home / Self Care | Payer: BLUE CROSS/BLUE SHIELD | Source: Ambulatory Visit | Attending: Obstetrics and Gynecology | Admitting: Obstetrics and Gynecology

## 2015-10-04 DIAGNOSIS — G43109 Migraine with aura, not intractable, without status migrainosus: Secondary | ICD-10-CM | POA: Diagnosis not present

## 2015-10-04 DIAGNOSIS — M797 Fibromyalgia: Secondary | ICD-10-CM | POA: Insufficient documentation

## 2015-10-04 DIAGNOSIS — D259 Leiomyoma of uterus, unspecified: Secondary | ICD-10-CM | POA: Insufficient documentation

## 2015-10-04 DIAGNOSIS — E669 Obesity, unspecified: Secondary | ICD-10-CM | POA: Diagnosis not present

## 2015-10-04 DIAGNOSIS — Z01812 Encounter for preprocedural laboratory examination: Secondary | ICD-10-CM | POA: Insufficient documentation

## 2015-10-04 DIAGNOSIS — M545 Low back pain: Secondary | ICD-10-CM | POA: Insufficient documentation

## 2015-10-04 HISTORY — DX: Gastro-esophageal reflux disease without esophagitis: K21.9

## 2015-10-04 HISTORY — DX: Unspecified osteoarthritis, unspecified site: M19.90

## 2015-10-04 HISTORY — DX: Headache, unspecified: R51.9

## 2015-10-04 HISTORY — DX: Anemia, unspecified: D64.9

## 2015-10-04 HISTORY — DX: Headache: R51

## 2015-10-04 LAB — CBC
HEMATOCRIT: 44.1 % (ref 36.0–46.0)
HEMOGLOBIN: 14.9 g/dL (ref 12.0–15.0)
MCH: 26.5 pg (ref 26.0–34.0)
MCHC: 33.8 g/dL (ref 30.0–36.0)
MCV: 78.3 fL (ref 78.0–100.0)
Platelets: 286 10*3/uL (ref 150–400)
RBC: 5.63 MIL/uL — ABNORMAL HIGH (ref 3.87–5.11)
RDW: 15.7 % — AB (ref 11.5–15.5)
WBC: 9.8 10*3/uL (ref 4.0–10.5)

## 2015-10-04 LAB — TYPE AND SCREEN
ABO/RH(D): A POS
Antibody Screen: NEGATIVE

## 2015-10-04 NOTE — Patient Instructions (Signed)
Your procedure is scheduled on:  Wednesday, Sept 27, 2017  Enter through the [employer] [facility] at:  6:00 AM  Pick up the phone at the desk and dial [phone].  Call this number if you have problems the morning of surgery: [phone].  Remember: Do NOT eat food or drink after:  Midnight Tuesday  Take these medicines the morning of surgery with a SIP OF WATER:  None  Do NOT wear jewelry (body piercing), metal hair clips/bobby pins, make-up, or nail polish. Do NOT wear lotions, powders, or perfumes.  You may wear deodorant. Do NOT shave for 48 hours prior to surgery. Do NOT bring valuables to the hospital. Contacts, dentures, or bridgework may not be worn into surgery.  Leave suitcase in car.  After surgery it may be brought to your room.  For patients admitted to the hospital, checkout time is 11:00 AM the day of discharge.

## 2015-10-10 NOTE — H&P (Signed)
40 y.o. yo complains of symptomatic fibroid uterus with severe menorhaghia.  Pt had been on OCPs trying to control bleeding and developed a DVT.  Pt is now >1 year from DVT and off treatment for it.  Her bleeding has been only slighly decreased with Megesterol and NorQD.  Pt desires definitive.   [place] 12x8x8; EM 8.7; RO 3x3; multiple fibroids, biggest 1.5 cm fibroid.   Past Medical History:  Diagnosis Date  . Anemia   . Arthritis    foot  . DVT (deep venous thrombosis) ([place])   . Fibroid uterus   . Fibromyalgia   . GERD (gastroesophageal reflux disease)   . Headache    Migraines  . Infertility management    Past Surgical History:  Procedure Laterality Date  . CARDIAC CATHETERIZATION  approx 8 years ago   for chest pain,   . CESAREAN SECTION    . CESAREAN SECTION N/A 02/29/2012   Procedure: Repeat CESAREAN SECTION of baby boy at [place] 8/8;  Surgeon: Daria Pastures, MD;  Location: [facility];  Service: Obstetrics;  Laterality: N/A;  . FOOT SURGERY     right foot  . TONSILLECTOMY    . TONSILLECTOMY      Social History   Social History  . Marital status: Married    Spouse name: N/A  . Number of children: 1  . Years of education: N/A   Occupational History  . Horticulturist, [employer]   Social History Main Topics  . Smoking status: Never Smoker  . Smokeless tobacco: Never Used  . Alcohol use Yes     Comment: 1-2 drinks per month.  . Drug use: No  . Sexual activity: Yes    Birth control/ protection: Pill   Other Topics Concern  . Not on file   Social History Narrative  . No narrative on file    No current facility-administered medications on file prior to encounter.    Current Outpatient Prescriptions on File Prior to Encounter  Medication Sig Dispense Refill  . ferrous sulfate 325 (65 FE) MG EC tablet Take 1 tablet (325 mg total) by mouth 2 (two) times daily. (Patient not taking: Reported on 09/28/2015) 60 tablet 1    No Known  Allergies  @VITALS2 @  Lungs: clear to ascultation Cor:  RRR Abdomen:  soft, nontender, nondistended. Ex:  no cords, erythema Pelvic:  Vulva: no masses, atrophy, or lesions. Bladder/Urethra: no urethral discharge or mass and normal meatus and bladder non distended. Vagina no tenderness, erythema, cystocele, rectocele, abnormal vaginal discharge, or vesicle(s) or ulcers. Cervix: no discharge or cervical motion tenderness and grossly normal. Uterus: normal shape (10) and midline, mobile, non-tender, and no uterine prolapse. Adnexa/Parametria: no parametrial tenderness or mass and no adnexal tenderness or ovarian mass.  A:  Symptomatic fibroid uterus with menorrhaghia.  Pt desires definitive therapy with Robo TLH/salpinigectomies/possible BSO.     P: All risks, benefits and alternatives d/w patient and she desires to proceed.  Patient has undergone a modified bowel prep and will receive preop antibiotics and SCDs during the operation.   Pt will receive lovenox for DVT prophylaxis post op.  Kainat Pizana A

## 2015-10-11 ENCOUNTER — Encounter (HOSPITAL_COMMUNITY)
Admission: RE | Disposition: A | Discharge status: Home / Self Care | Payer: Self-pay | Source: Ambulatory Visit | Attending: Obstetrics and Gynecology

## 2015-10-11 ENCOUNTER — Ambulatory Visit (HOSPITAL_COMMUNITY): Payer: BLUE CROSS/BLUE SHIELD | Admitting: Certified Registered Nurse Anesthetist

## 2015-10-11 ENCOUNTER — Encounter (HOSPITAL_COMMUNITY): Payer: Self-pay

## 2015-10-11 ENCOUNTER — Ambulatory Visit (HOSPITAL_COMMUNITY)
Admission: RE | Admit: 2015-10-11 | Discharge: 2015-10-12 | Disposition: A | Discharge status: Home / Self Care | Payer: BLUE CROSS/BLUE SHIELD | Source: Ambulatory Visit | Attending: Obstetrics and Gynecology | Admitting: Obstetrics and Gynecology

## 2015-10-11 DIAGNOSIS — Z9889 Other specified postprocedural states: Secondary | ICD-10-CM

## 2015-10-11 DIAGNOSIS — M797 Fibromyalgia: Secondary | ICD-10-CM | POA: Diagnosis not present

## 2015-10-11 DIAGNOSIS — N920 Excessive and frequent menstruation with regular cycle: Secondary | ICD-10-CM | POA: Insufficient documentation

## 2015-10-11 DIAGNOSIS — K219 Gastro-esophageal reflux disease without esophagitis: Secondary | ICD-10-CM | POA: Insufficient documentation

## 2015-10-11 DIAGNOSIS — D259 Leiomyoma of uterus, unspecified: Secondary | ICD-10-CM | POA: Diagnosis not present

## 2015-10-11 DIAGNOSIS — M1990 Unspecified osteoarthritis, unspecified site: Secondary | ICD-10-CM | POA: Diagnosis not present

## 2015-10-11 DIAGNOSIS — N840 Polyp of corpus uteri: Secondary | ICD-10-CM | POA: Diagnosis not present

## 2015-10-11 DIAGNOSIS — Z86718 Personal history of other venous thrombosis and embolism: Secondary | ICD-10-CM | POA: Diagnosis not present

## 2015-10-11 DIAGNOSIS — N9971 Accidental puncture and laceration of a genitourinary system organ or structure during a genitourinary system procedure: Secondary | ICD-10-CM | POA: Insufficient documentation

## 2015-10-11 DIAGNOSIS — N800 Endometriosis of uterus: Secondary | ICD-10-CM | POA: Diagnosis not present

## 2015-10-11 DIAGNOSIS — D251 Intramural leiomyoma of uterus: Secondary | ICD-10-CM | POA: Diagnosis not present

## 2015-10-11 HISTORY — PX: ROBOTIC ASSISTED TOTAL HYSTERECTOMY: SHX6085

## 2015-10-11 HISTORY — PX: LAPAROSCOPIC BILATERAL SALPINGECTOMY: SHX5889

## 2015-10-11 HISTORY — PX: CYSTOSCOPY: SHX5120

## 2015-10-11 LAB — CREATININE, SERUM
CREATININE: 0.84 mg/dL (ref 0.44–1.00)
GFR calc Af Amer: >60 mL/min (ref >60)

## 2015-10-11 LAB — PREGNANCY, URINE: PREG TEST UR: NEGATIVE

## 2015-10-11 SURGERY — ROBOTIC ASSISTED TOTAL HYSTERECTOMY
Anesthesia: General | Site: Abdomen

## 2015-10-11 MED ORDER — ARTIFICIAL TEARS OP OINT
TOPICAL_OINTMENT | OPHTHALMIC | Status: AC
  Filled 2015-10-11: qty 3.5

## 2015-10-11 MED ORDER — PROPOFOL 10 MG/ML IV BOLUS
INTRAVENOUS | Status: DC | PRN
  Administered 2015-10-11: 180 mg via INTRAVENOUS

## 2015-10-11 MED ORDER — ROCURONIUM BROMIDE 100 MG/10ML IV SOLN
INTRAVENOUS | Status: AC
  Filled 2015-10-11: qty 1

## 2015-10-11 MED ORDER — SCOPOLAMINE 1 MG/3DAYS TD PT72
1.0000 | MEDICATED_PATCH | Freq: Once | TRANSDERMAL | Status: DC
  Administered 2015-10-11: 1.5 mg via TRANSDERMAL

## 2015-10-11 MED ORDER — SUGAMMADEX SODIUM 200 MG/2ML IV SOLN
INTRAVENOUS | Status: DC | PRN
  Administered 2015-10-11: 173.2 mg via INTRAVENOUS

## 2015-10-11 MED ORDER — CEFAZOLIN SODIUM-DEXTROSE 2-4 GM/100ML-% IV SOLN
INTRAVENOUS | Status: AC
  Filled 2015-10-11: qty 100

## 2015-10-11 MED ORDER — SODIUM CHLORIDE 0.9 % IJ SOLN
INTRAMUSCULAR | Status: AC
  Filled 2015-10-11: qty 50

## 2015-10-11 MED ORDER — FENTANYL CITRATE (PF) 250 MCG/5ML IJ SOLN
INTRAMUSCULAR | Status: AC
  Filled 2015-10-11: qty 5

## 2015-10-11 MED ORDER — MEPERIDINE HCL 25 MG/ML IJ SOLN: 6.2500 mg | INTRAMUSCULAR | Status: DC | PRN

## 2015-10-11 MED ORDER — HYDROMORPHONE HCL 1 MG/ML IJ SOLN
INTRAMUSCULAR | Status: AC
  Filled 2015-10-11: qty 1

## 2015-10-11 MED ORDER — PHENYLEPHRINE HCL 10 MG/ML IJ SOLN
INTRAMUSCULAR | Status: DC | PRN
  Administered 2015-10-11 (×5): 40 ug via INTRAVENOUS

## 2015-10-11 MED ORDER — SUGAMMADEX SODIUM 200 MG/2ML IV SOLN
INTRAVENOUS | Status: AC
  Filled 2015-10-11: qty 2

## 2015-10-11 MED ORDER — PROPOFOL 10 MG/ML IV BOLUS
INTRAVENOUS | Status: AC
  Filled 2015-10-11: qty 20

## 2015-10-11 MED ORDER — OXYCODONE HCL 5 MG PO TABS: 5.0000 mg | ORAL_TABLET | Freq: Once | ORAL | Status: DC | PRN

## 2015-10-11 MED ORDER — MIDAZOLAM HCL 2 MG/2ML IJ SOLN
INTRAMUSCULAR | Status: AC
  Filled 2015-10-11: qty 2

## 2015-10-11 MED ORDER — MENTHOL 3 MG MT LOZG
1.0000 | LOZENGE | OROMUCOSAL | Status: DC | PRN
Start: 2015-10-11 — End: 2015-10-12

## 2015-10-11 MED ORDER — FENTANYL CITRATE (PF) 100 MCG/2ML IJ SOLN
INTRAMUSCULAR | Status: DC | PRN
  Administered 2015-10-11: 100 ug via INTRAVENOUS
  Administered 2015-10-11: 50 ug via INTRAVENOUS
  Administered 2015-10-11 (×3): 100 ug via INTRAVENOUS
  Administered 2015-10-11: 50 ug via INTRAVENOUS

## 2015-10-11 MED ORDER — ONDANSETRON HCL 4 MG/2ML IJ SOLN
INTRAMUSCULAR | Status: DC | PRN
  Administered 2015-10-11: 4 mg via INTRAVENOUS

## 2015-10-11 MED ORDER — ROCURONIUM BROMIDE 100 MG/10ML IV SOLN
INTRAVENOUS | Status: DC | PRN
Start: 2015-10-11 — End: 2015-10-11
  Administered 2015-10-11: 40 mg via INTRAVENOUS
  Administered 2015-10-11: 20 mg via INTRAVENOUS
  Administered 2015-10-11 (×2): 10 mg via INTRAVENOUS

## 2015-10-11 MED ORDER — SCOPOLAMINE 1 MG/3DAYS TD PT72
MEDICATED_PATCH | TRANSDERMAL | Status: AC
  Administered 2015-10-11: 1.5 mg via TRANSDERMAL
  Filled 2015-10-11: qty 1

## 2015-10-11 MED ORDER — STERILE WATER FOR IRRIGATION IR SOLN
Status: DC | PRN
  Administered 2015-10-11: 1000 mL

## 2015-10-11 MED ORDER — SODIUM CHLORIDE 0.9 % IV SOLN
INTRAVENOUS | Status: DC | PRN
  Administered 2015-10-11: 60 mL

## 2015-10-11 MED ORDER — LACTATED RINGERS IV SOLN
INTRAVENOUS | Status: DC
  Administered 2015-10-11: 15:00:00 via INTRAVENOUS

## 2015-10-11 MED ORDER — KETOROLAC TROMETHAMINE 30 MG/ML IJ SOLN: 30.0000 mg | Freq: Four times a day (QID) | INTRAMUSCULAR | Status: DC

## 2015-10-11 MED ORDER — DEXAMETHASONE SODIUM PHOSPHATE 10 MG/ML IJ SOLN
INTRAMUSCULAR | Status: DC | PRN
  Administered 2015-10-11: 4 mg via INTRAVENOUS

## 2015-10-11 MED ORDER — MIDAZOLAM HCL 2 MG/2ML IJ SOLN
INTRAMUSCULAR | Status: DC | PRN
  Administered 2015-10-11: 2 mg via INTRAVENOUS

## 2015-10-11 MED ORDER — ONDANSETRON HCL 4 MG/2ML IJ SOLN
INTRAMUSCULAR | Status: AC
  Filled 2015-10-11: qty 2

## 2015-10-11 MED ORDER — LACTATED RINGERS IV SOLN
INTRAVENOUS | Status: DC
  Administered 2015-10-11: 125 mL/h via INTRAVENOUS
  Administered 2015-10-11: 11:00:00 via INTRAVENOUS

## 2015-10-11 MED ORDER — OXYCODONE-ACETAMINOPHEN 5-325 MG PO TABS
1.0000 | ORAL_TABLET | ORAL | Status: DC | PRN
  Administered 2015-10-11 (×2): 2 via ORAL
  Administered 2015-10-12 (×2): 1 via ORAL
  Filled 2015-10-11 (×2): qty 2
  Filled 2015-10-11: qty 1
  Filled 2015-10-11: qty 2

## 2015-10-11 MED ORDER — IBUPROFEN 800 MG PO TABS: 800.0000 mg | ORAL_TABLET | Freq: Three times a day (TID) | ORAL | Status: DC | PRN

## 2015-10-11 MED ORDER — PHENYLEPHRINE 40 MCG/ML (10ML) SYRINGE FOR IV PUSH (FOR BLOOD PRESSURE SUPPORT)
PREFILLED_SYRINGE | INTRAVENOUS | Status: AC
  Filled 2015-10-11: qty 10

## 2015-10-11 MED ORDER — HYDROMORPHONE HCL 1 MG/ML IJ SOLN
INTRAMUSCULAR | Status: AC
  Administered 2015-10-11: 0.25 mg via INTRAVENOUS
  Filled 2015-10-11: qty 1

## 2015-10-11 MED ORDER — ONDANSETRON HCL 4 MG PO TABS
4.0000 mg | ORAL_TABLET | Freq: Four times a day (QID) | ORAL | Status: DC | PRN
  Administered 2015-10-11: 4 mg via ORAL
  Filled 2015-10-11: qty 1

## 2015-10-11 MED ORDER — KETOROLAC TROMETHAMINE 30 MG/ML IJ SOLN
INTRAMUSCULAR | Status: AC
  Filled 2015-10-11: qty 1

## 2015-10-11 MED ORDER — LIDOCAINE HCL (CARDIAC) 20 MG/ML IV SOLN
INTRAVENOUS | Status: DC | PRN
  Administered 2015-10-11: 100 mg via INTRAVENOUS

## 2015-10-11 MED ORDER — HYDROMORPHONE HCL 1 MG/ML IJ SOLN
0.2500 mg | INTRAMUSCULAR | Status: DC | PRN
  Administered 2015-10-11 (×3): 0.25 mg via INTRAVENOUS

## 2015-10-11 MED ORDER — ONDANSETRON HCL 4 MG/2ML IJ SOLN: 4.0000 mg | Freq: Once | INTRAMUSCULAR | Status: DC | PRN

## 2015-10-11 MED ORDER — ONDANSETRON HCL 4 MG/2ML IJ SOLN: 4.0000 mg | Freq: Four times a day (QID) | INTRAMUSCULAR | Status: DC | PRN

## 2015-10-11 MED ORDER — ENOXAPARIN SODIUM 40 MG/0.4ML ~~LOC~~ SOLN
40.0000 mg | SUBCUTANEOUS | Status: DC
  Administered 2015-10-12: 40 mg via SUBCUTANEOUS
  Filled 2015-10-11: qty 0.4

## 2015-10-11 MED ORDER — OXYCODONE HCL 5 MG/5ML PO SOLN: 5.0000 mg | Freq: Once | ORAL | Status: DC | PRN

## 2015-10-11 MED ORDER — CEFAZOLIN SODIUM-DEXTROSE 2-4 GM/100ML-% IV SOLN
2.0000 g | INTRAVENOUS | Status: AC
  Administered 2015-10-11 (×2): 2 g via INTRAVENOUS

## 2015-10-11 MED ORDER — HYDROMORPHONE HCL 1 MG/ML IJ SOLN
INTRAMUSCULAR | Status: DC | PRN
  Administered 2015-10-11 (×3): 0.5 mg via INTRAVENOUS

## 2015-10-11 MED ORDER — LACTATED RINGERS IR SOLN
Status: DC | PRN
  Administered 2015-10-11: 3000 mL

## 2015-10-11 MED ORDER — LIDOCAINE HCL (CARDIAC) 20 MG/ML IV SOLN
INTRAVENOUS | Status: AC
  Filled 2015-10-11: qty 5

## 2015-10-11 MED ORDER — DEXAMETHASONE SODIUM PHOSPHATE 4 MG/ML IJ SOLN
INTRAMUSCULAR | Status: AC
  Filled 2015-10-11: qty 1

## 2015-10-11 MED ORDER — ROPIVACAINE HCL 5 MG/ML IJ SOLN
INTRAMUSCULAR | Status: AC
  Filled 2015-10-11: qty 30

## 2015-10-11 SURGICAL SUPPLY — 73 items
APL SRG 38 LTWT LNG FL B (MISCELLANEOUS) ×3
APPLICATOR ARISTA FLEXITIP XL (MISCELLANEOUS) ×1 IMPLANT
BARRIER ADHS 3X4 INTERCEED (GAUZE/BANDAGES/DRESSINGS) ×4 IMPLANT
BRR ADH 4X3 ABS CNTRL BYND (GAUZE/BANDAGES/DRESSINGS) ×3
CLOTH BEACON ORANGE TIMEOUT ST (SAFETY) ×4 IMPLANT
CONT PATH 16OZ SNAP LID 3702 (MISCELLANEOUS) ×4 IMPLANT
COVER BACK TABLE 60X90IN (DRAPES) ×8 IMPLANT
COVER TIP SHEARS 8 DVNC (MISCELLANEOUS) ×3 IMPLANT
COVER TIP SHEARS 8MM DA VINCI (MISCELLANEOUS) ×2
DECANTER SPIKE VIAL GLASS SM (MISCELLANEOUS) ×8 IMPLANT
DEFOGGER SCOPE WARMER CLEARIFY (MISCELLANEOUS) ×4 IMPLANT
DURAPREP 26ML APPLICATOR (WOUND CARE) ×4 IMPLANT
ELECT REM PT RETURN 9FT ADLT (ELECTROSURGICAL) ×4
ELECTRODE REM PT RTRN 9FT ADLT (ELECTROSURGICAL) ×3 IMPLANT
GAUZE VASELINE 3X9 (GAUZE/BANDAGES/DRESSINGS) IMPLANT
GLOVE BIO SURGEON STRL SZ 6.5 (GLOVE) ×4 IMPLANT
GLOVE BIO SURGEON STRL SZ7 (GLOVE) ×8 IMPLANT
GLOVE BIOGEL PI IND STRL 6.5 (GLOVE) ×3 IMPLANT
GLOVE BIOGEL PI IND STRL 7.0 (GLOVE) ×9 IMPLANT
GLOVE BIOGEL PI INDICATOR 6.5 (GLOVE) ×1
GLOVE BIOGEL PI INDICATOR 7.0 (GLOVE) ×5
GLOVE ECLIPSE 6.5 STRL STRAW (GLOVE) ×12 IMPLANT
GRASPER BIPOLAR FEN DA VINCI (INSTRUMENTS)
GRASPER BPLR FEN DVNC (INSTRUMENTS) IMPLANT
GYRUS RUMI II 2.5CM BLUE (DISPOSABLE) ×4
GYRUS RUMI II 3.5CM BLUE (DISPOSABLE)
GYRUS RUMI II 4.0CM BLUE (DISPOSABLE)
HEMOSTAT ARISTA ABSORB 3G PWDR (MISCELLANEOUS) ×1 IMPLANT
KIT ACCESSORY DA VINCI DISP (KITS) ×1
KIT ACCESSORY DVNC DISP (KITS) ×3 IMPLANT
LEGGING LITHOTOMY PAIR STRL (DRAPES) ×4 IMPLANT
LIQUID BAND (GAUZE/BANDAGES/DRESSINGS) ×4 IMPLANT
MANIPULATOR UTERINE 4.5 ZUMI (MISCELLANEOUS) IMPLANT
NEEDLE INSUFFLATION 120MM (ENDOMECHANICALS) ×4 IMPLANT
OCCLUDER COLPOPNEUMO (BALLOONS) ×2 IMPLANT
PACK ROBOT WH (CUSTOM PROCEDURE TRAY) ×4 IMPLANT
PACK ROBOTIC GOWN (GOWN DISPOSABLE) ×4 IMPLANT
PACK TRENDGUARD 450 HYBRID PRO (MISCELLANEOUS) IMPLANT
PACK TRENDGUARD 600 HYBRD PROC (MISCELLANEOUS) IMPLANT
PAD PREP 24X48 CUFFED NSTRL (MISCELLANEOUS) ×4 IMPLANT
RUMI II 3.0CM BLUE KOH-EFFICIE (DISPOSABLE) IMPLANT
RUMI II GYRUS 2.5CM BLUE (DISPOSABLE) IMPLANT
RUMI II GYRUS 3.5CM BLUE (DISPOSABLE) IMPLANT
RUMI II GYRUS 4.0CM BLUE (DISPOSABLE) IMPLANT
SET CYSTO W/LG BORE CLAMP LF (SET/KITS/TRAYS/PACK) ×1 IMPLANT
SET IRRIG TUBING LAPAROSCOPIC (IRRIGATION / IRRIGATOR) ×4 IMPLANT
SET TRI-LUMEN FLTR TB AIRSEAL (TUBING) ×1 IMPLANT
SUT DVC VLOC 180 0 12IN GS21 (SUTURE)
SUT VIC AB 2-0 CT1 18 (SUTURE) ×1 IMPLANT
SUT VIC AB 2-0 CT2 27 (SUTURE) ×8 IMPLANT
SUT VIC AB 2-0 UR6 27 (SUTURE) ×4 IMPLANT
SUT VIC AB 3-0 SH 27 (SUTURE) ×8
SUT VIC AB 3-0 SH 27X BRD (SUTURE) IMPLANT
SUT VICRYL RAPIDE 3 0 (SUTURE) ×8 IMPLANT
SUT VLOC 180 0 9IN  GS21 (SUTURE) ×1
SUT VLOC 180 0 9IN GS21 (SUTURE) IMPLANT
SUTURE DVC VLC 180 0 12IN GS21 (SUTURE) IMPLANT
SYR 50ML LL SCALE MARK (SYRINGE) ×4 IMPLANT
TIP RUMI ORANGE 6.7MMX12CM (TIP) IMPLANT
TIP UTERINE 5.1X6CM LAV DISP (MISCELLANEOUS) IMPLANT
TIP UTERINE 6.7X10CM GRN DISP (MISCELLANEOUS) ×1 IMPLANT
TIP UTERINE 6.7X6CM WHT DISP (MISCELLANEOUS) IMPLANT
TIP UTERINE 6.7X8CM BLUE DISP (MISCELLANEOUS) IMPLANT
TOWEL OR 17X24 6PK STRL BLUE (TOWEL DISPOSABLE) ×8 IMPLANT
TRAY FOLEY CATH SILVER 14FR (SET/KITS/TRAYS/PACK) ×4 IMPLANT
TRENDGUARD 450 HYBRID PRO PACK (MISCELLANEOUS) ×4
TRENDGUARD 600 HYBRID PROC PK (MISCELLANEOUS)
TROCAR DILATING TIP 12MM 150MM (ENDOMECHANICALS) ×4 IMPLANT
TROCAR DISP BLADELESS 8 DVNC (TROCAR) ×3 IMPLANT
TROCAR DISP BLADELESS 8MM (TROCAR) ×1
TROCAR PORT AIRSEAL 5X120 (TROCAR) ×1 IMPLANT
TUBING NON-CON 1/4 X 20 CONN (TUBING) ×1 IMPLANT
WATER STERILE IRR 1000ML POUR (IV SOLUTION) ×4 IMPLANT

## 2015-10-11 NOTE — Anesthesia Postprocedure Evaluation (Signed)
Anesthesia Post Note  Patient: Peggy [name]  Procedure(s) Performed: Procedure(s) (LRB): ROBOTIC ASSISTED TOTAL HYSTERECTOMY, Laparoscopic Repair Cystotomy (N/A) LAPAROSCOPIC BILATERAL SALPINGECTOMY CYSTOSCOPY  Patient location during evaluation: [facility] Anesthesia Type: General Level of consciousness: awake and alert Pain management: satisfactory to patient Vital Signs Assessment: post-procedure vital signs reviewed and stable Respiratory status: spontaneous breathing and respiratory function stable Cardiovascular status: stable Postop Assessment: adequate PO intake Anesthetic complications: no     Last Vitals:  Vitals:   10/11/15 1320 10/11/15 1430  BP: 126/83 112/84  Pulse: 94 84  Resp: 14 16  Temp: 37.1 C 36.8 C    Last Pain:  Vitals:   10/11/15 1517  TempSrc:   PainSc: Asleep   Pain Goal: Patients Stated Pain Goal: 3 (10/11/15 [id number])               Katherina Mires

## 2015-10-11 NOTE — Progress Notes (Signed)
There has been no change in the patients history, status or exam since the history and physical.  Vitals:   10/11/15 0633  BP: (!) 132/99  Pulse: 89  Resp: (!) 6  Temp: 98.3 F (36.8 C)  TempSrc: Oral  SpO2: 98%    Lab Results  Component Value Date   WBC 9.8 10/04/2015   HGB 14.9 10/04/2015   HCT 44.1 10/04/2015   MCV 78.3 10/04/2015   PLT 286 10/04/2015    Manfred Laspina A

## 2015-10-11 NOTE — Anesthesia Preprocedure Evaluation (Signed)
Anesthesia Evaluation  Patient identified by MRN, date of birth, ID band Patient awake    Reviewed: Allergy & Precautions, NPO status , Patient's Chart, lab work & pertinent test results  History of Anesthesia Complications Negative for: history of anesthetic complications  Airway Mallampati: III  TM Distance: >3 FB Neck ROM: Full    Dental no notable dental hx. (+) Dental Advisory Given   Pulmonary neg pulmonary ROS,    Pulmonary exam normal breath sounds clear to auscultation       Cardiovascular negative cardio ROS Normal cardiovascular exam Rhythm:Regular Rate:Normal     Neuro/Psych  Headaches, negative psych ROS   GI/Hepatic Neg liver ROS, GERD  Medicated and Controlled,  Endo/Other  negative endocrine ROS  Renal/GU negative Renal ROS  negative genitourinary   Musculoskeletal  (+) Arthritis , Fibromyalgia -  Abdominal   Peds negative pediatric ROS (+)  Hematology negative hematology ROS (+)   Anesthesia Other Findings   Reproductive/Obstetrics negative OB ROS                             Anesthesia Physical Anesthesia Plan  ASA: II  Anesthesia Plan: General   Post-op Pain Management:    Induction: Intravenous  Airway Management Planned: Oral ETT  Additional Equipment:   Intra-op Plan:   Post-operative Plan: Extubation in OR  Informed Consent: I have reviewed the patients History and Physical, chart, labs and discussed the procedure including the risks, benefits and alternatives for the proposed anesthesia with the patient or authorized representative who has indicated his/her understanding and acceptance.   Dental advisory given  Plan Discussed with: CRNA  Anesthesia Plan Comments:         Anesthesia Quick Evaluation

## 2015-10-11 NOTE — Brief Op Note (Addendum)
10/11/2015  11:37 AM  PATIENT:  Peggy [name]  40 y.o. female  PRE-OPERATIVE DIAGNOSIS:  FIBROIDS  POST-OPERATIVE DIAGNOSIS:  FIBROIDS  PROCEDURE:  Procedure(s): ROBOTIC ASSISTED TOTAL HYSTERECTOMY (N/A)  SURGEON:  Surgeon(s) and Role:    * Bobbye Charleston, MD - Primary    * Jerelyn Charles, MD - Assisting  ANESTHESIA:   general  EBL:  Total I/O In: 1400 [I.V.:1400] Out: 400 [Urine:200; Blood:200]  LOCAL MEDICATIONS USED:  OTHER Ropivicaine and Arista  SPECIMEN:  Source of Specimen:  uterus, cervix, tubes  DISPOSITION OF SPECIMEN:  PATHOLOGY  COUNTS:  YES  TOURNIQUET:  * No tourniquets in log *  DICTATION: .Note written in EPIC  PLAN OF CARE: Admit for overnight observation  PATIENT DISPOSITION:  PACU - hemodynamically stable.   Delay start of Pharmacological VTE agent (>24hrs) due to surgical blood loss or risk of bleeding: no  Complications:  Bladder cystotomy and small vaginal laceration.  Findings:  15 weeks size uterus, very boggy and soft.  A frozen section confirmed adenomyosis.  R&L ovaries were normal.  There was a 1.5 cm incidental cystotomy done during the hysterectomy that was repaired with 2-0 vicryl. The ureters were identified during multiple points of the case and were always out of the field of dissection.  On cystoscopy at the end of the case, the bladder was intact and bilateral spill was seen from each ureteral orriface.  The cystotomy was closed tightly and was intact and very far away from the trigone.   Medications:  Ancef.  Ropivicaine.  Arista  Technique:  After adequate anesthesia was achieved the patient was positioned, prepped and draped in usual sterile fashion.  A speculum was placed in the vagina and the cervix dilated with pratt dilators.  The 10 cm Rumi and 2.5 cm Koh ring were assembled and placed in proper fashion.  The  Speculum was removed and the bladder catheterized with a foley.    Attention was turned to the abdomen where a 1 cm  incision was made 1 cm above the umbilicus.  The veress needle was introduced without aspiration of bowel contents or blood and the abdomen insufflated. The long 12 mm trocar was placed and the other three trocar sites were marked out, all approximately 10 cm from each other and the camera.  Two 8.5 mm trocars were placed on either side of the camera port and a 5 mm assistant port was placed 3 cm above the L iliac crest.  All trocars were inserted under direct visualization of the camera.  The patient was placed in trendelenburg and then the Robot docked.  The PK forceps were placed on arm 2 and the Hot shears on arm 1 and introduced under direct visualization of the camera.  I then broke scrub and sat down at the console.  The above findings were noted and the ureters identified well out of the field of dissection.  The right fallopian tube was isolated and cauterized with the PK.  The Utero-ovarian ligament was then divided with the PK cautery and shears.  The posterior broad ligament was then divided with the hot shears until the uterosacral ligament.  The Broad and cardinal ligaments were then cauterized against the cervix to the level of the Koh ring, securing the uterine artery.  Each pedicle was then incised with the shears.  The anterior leaf was then incised at the reflection of the vessico-uterine junction and the lateral bladder retracted inferiorly after the round ligament had been  divided with the PK forceps.  The left tube was cauterized with the PK and divided with the shears;  then the left utero-ovarian ligament divided with the PK forceps and the scissors.  The round ligament was divided as well and the posterior leaf of the broad ligament then divided with the hot shears. The broad and cardinal ligaments were then cauterized on the left in the same way.   At the level of the internal os, the uterine arteries were bilaterally cauterized with the PK.  The ureters were identified well out of the  field of dissection.  .   The bladder was then able to be retracted inferiorly and the vesico-uterine fascia was incised in the midline until the bladder was removed one cm below the Koh ring.  At this point, an incidental cystotomy was noted on L side of bladder.  The suture cut needle driver replaced the hot shears and the cystotomy was closed with the robot instrument in a running manner.  The same suture was then continued as a second layer imbricating the first.  The hot shears were replaced and then used circumferentially to incise the vagina at the level of the reflection on the [facility] ring.  Once the uterus and cervix were amputated, cautery was used to insure hemostasis of the cuff vessels.  The two needles were removed through the vagina.  The uterus was needed to be bivalved to be removed- this was done with the monopolar cautery.  The uterus was grasped and pulled several times with the jacobsons tenaculum but the uterus was so soft that it essentially came off with pieces of the uterus.  My hand then was used to get better purchase of the uterus through the vagina and I was finally able to remove the rest of the uterus in one piece.  During the attempts at removal, the rectal epiploica were abraided on the R side of the pelvis and the posterior vagina was lacerated to about 2 cm across and superficially.  The abrasion of the epiploica was hemostatic and not involving any structures.  The vaginal laceration was bleeding and repaired with 3 figure of eight stitches.  I returned to the abdomen after changing and let the gas down to insure hemostasi. Once hemostasis was achieved, the instruments were changed to the mega needle driver and mega suture cut needle driver and the cuff was closed with a running stitches of 0-vicryl V loc.    Cautery was used to ensure hemostasis of the right pedicles very superficially.  The ureters were peristalsing bilaterally well and very lateral to the areas of operation.     The Robot was then undocked and I scrubbed back in.  The needle was removed and Bupivicaine was introduced into the pelvis.  Arista was placed over all cut surfaces to insure any small bleeders would be hemostatic. The fascia of the 12 mm trocar was closed with a figure of 8 stitch of 0 vicryl.  The skin incisions were closed with subcuticular stitches of 3-0 vicryl Rapide and Dermabond.  All instruments were removed from the vagina and cystoscopy performed, revealing an intact bladder and vigourous spill of urine from each ureteral orifice, the bladder dome intact and the cystotomy intact.  The cystoscope was removed and the patient taken to the recovery room in stable condition.  Dafna Romo A

## 2015-10-11 NOTE — Addendum Note (Signed)
Addendum  created 10/11/15 1639 by Flossie Dibble, CRNA   Sign clinical note

## 2015-10-11 NOTE — Anesthesia Postprocedure Evaluation (Signed)
Anesthesia Post Note  Patient: Peggy [name]  Procedure(s) Performed: Procedure(s) (LRB): ROBOTIC ASSISTED TOTAL HYSTERECTOMY, Laparoscopic Repair Cystotomy (N/A) LAPAROSCOPIC BILATERAL SALPINGECTOMY CYSTOSCOPY  Patient location during evaluation: PACU Anesthesia Type: General Level of consciousness: awake and alert Pain management: pain level controlled Vital Signs Assessment: post-procedure vital signs reviewed and stable Respiratory status: spontaneous breathing, nonlabored ventilation and respiratory function stable Cardiovascular status: blood pressure returned to baseline and stable Postop Assessment: no signs of nausea or vomiting Anesthetic complications: no     Last Vitals:  Vitals:   10/11/15 0633 10/11/15 1145  BP: (!) 132/99   Pulse: 89   Resp: (!) 6   Temp: 36.8 C 37.1 C    Last Pain:  Vitals:   10/11/15 1145  TempSrc:   PainSc: 0-No pain   Pain Goal: Patients Stated Pain Goal: 3 (10/11/15 [id number])               Viann Nielson A

## 2015-10-11 NOTE — Transfer of Care (Signed)
Immediate Anesthesia Transfer of Care Note  Patient: Peggy [name]  Procedure(s) Performed: Procedure(s): ROBOTIC ASSISTED TOTAL HYSTERECTOMY, Laparoscopic Repair Cystotomy (N/A) LAPAROSCOPIC BILATERAL SALPINGECTOMY CYSTOSCOPY  Patient Location: PACU  Anesthesia Type:General  Level of Consciousness: awake, alert  and oriented  Airway & Oxygen Therapy: Patient Spontanous Breathing and Patient connected to nasal cannula oxygen  Post-op Assessment: Report given to RN, Post -op Vital signs reviewed and stable and Patient moving all extremities  Post vital signs: Reviewed and stable  Last Vitals:  Vitals:   10/11/15 0633  BP: (!) 132/99  Pulse: 89  Resp: (!) 6  Temp: 36.8 C    Last Pain:  Vitals:   10/11/15 0633  TempSrc: Oral      Patients Stated Pain Goal: 3 ([id number])  Complications: No apparent anesthesia complications

## 2015-10-11 NOTE — Anesthesia Procedure Notes (Signed)
Procedure Name: Intubation Date/Time: 10/11/2015 7:25 AM Performed by: Hewitt Blade Pre-anesthesia Checklist: Patient identified, Emergency Drugs available, Suction available and Patient being monitored Patient Re-evaluated:Patient Re-evaluated prior to inductionOxygen Delivery Method: Circle system utilized Preoxygenation: Pre-oxygenation with 100% oxygen Intubation Type: IV induction Ventilation: Oral airway inserted - appropriate to patient size Laryngoscope Size: Glidescope Grade View: Grade I Tube type: Oral Tube size: 7.0 mm Number of attempts: 1 Airway Equipment and Method: Rigid stylet Placement Confirmation: ETT inserted through vocal cords under direct vision,  positive ETCO2 and breath sounds checked- equal and bilateral Secured at: 21 cm Tube secured with: Tape Dental Injury: Teeth and Oropharynx as per pre-operative assessment

## 2015-10-11 NOTE — Op Note (Signed)
10/11/2015  11:37 AM  PATIENT:  Peggy [name]  40 y.o. female  PRE-OPERATIVE DIAGNOSIS:  FIBROIDS  POST-OPERATIVE DIAGNOSIS:  FIBROIDS, adenomyosis  PROCEDURE:  Procedure(s): ROBOTIC ASSISTED TOTAL HYSTERECTOMY (N/A), bilateral salpingectomies, cystotomy repair, cystoscopy  SURGEON:  Surgeon(s) and Role:    * Bobbye Charleston, MD - Primary    * Jerelyn Charles, MD - Assisting  ANESTHESIA:   general  EBL:  Total I/O In: 1400 [I.V.:1400] Out: 400 [Urine:200; Blood:200]  LOCAL MEDICATIONS USED:  OTHER Ropivicaine and Arista  SPECIMEN:  Source of Specimen:  uterus, cervix, tubes  DISPOSITION OF SPECIMEN:  PATHOLOGY  COUNTS:  YES  TOURNIQUET:  * No tourniquets in log *  DICTATION: .Note written in EPIC  PLAN OF CARE: Admit for overnight observation  PATIENT DISPOSITION:  PACU - hemodynamically stable.   Delay start of Pharmacological VTE agent (>24hrs) due to surgical blood loss or risk of bleeding: no  Complications:  None.  Findings:  15 weeks size uterus, very boggy and soft.  A frozen section confirmed adenomyosis.  R&L ovaries were normal.  There was a 1.5 cm incidental cystotomy done during the hysterectomy that was repaired with 2-0 vicryl. The ureters were identified during multiple points of the case and were always out of the field of dissection.  On cystoscopy at the end of the case, the bladder was intact and bilateral spill was seen from each ureteral orriface.  The cystotomy was closed tightly and was intact and very far away from the trigone.   Medications:  Ancef.  Ropivicaine.  Arista  Technique:  After adequate anesthesia was achieved the patient was positioned, prepped and draped in usual sterile fashion.  A speculum was placed in the vagina and the cervix dilated with pratt dilators.  The 10 cm Rumi and 2.5 cm Koh ring were assembled and placed in proper fashion.  The  Speculum was removed and the bladder catheterized with a foley.    Attention was turned  to the abdomen where a 1 cm incision was made 1 cm above the umbilicus.  The veress needle was introduced without aspiration of bowel contents or blood and the abdomen insufflated. The long 12 mm trocar was placed and the other three trocar sites were marked out, all approximately 10 cm from each other and the camera.  Two 8.5 mm trocars were placed on either side of the camera port and a 5 mm assistant port was placed 3 cm above the L iliac crest.  All trocars were inserted under direct visualization of the camera.  The patient was placed in trendelenburg and then the Robot docked.  The PK forceps were placed on arm 2 and the Hot shears on arm 1 and introduced under direct visualization of the camera.  I then broke scrub and sat down at the console.  The above findings were noted and the ureters identified well out of the field of dissection.  The right fallopian tube was isolated and cauterized with the PK.  The Utero-ovarian ligament was then divided with the PK cautery and shears.  The posterior broad ligament was then divided with the hot shears until the uterosacral ligament.  The Broad and cardinal ligaments were then cauterized against the cervix to the level of the Koh ring, securing the uterine artery.  Each pedicle was then incised with the shears.  The anterior leaf was then incised at the reflection of the vessico-uterine junction and the lateral bladder retracted inferiorly after the round ligament had  been divided with the PK forceps.  The left tube was cauterized with the PK and divided with the shears;  then the left utero-ovarian ligament divided with the PK forceps and the scissors.  The round ligament was divided as well and the posterior leaf of the broad ligament then divided with the hot shears. The broad and cardinal ligaments were then cauterized on the left in the same way.   At the level of the internal os, the uterine arteries were bilaterally cauterized with the PK.  The ureters were  identified well out of the field of dissection.  .   The bladder was then able to be retracted inferiorly and the vesico-uterine fascia was incised in the midline until the bladder was removed one cm below the Koh ring.  At this point, an incidental cystotomy was noted on L side of bladder.  The suture cut needle driver replaced the hot shears and the cystotomy was closed with the robot instrument in a running manner.  The same suture was then continued as a second layer imbricating the first.  The hot shears were replaced and then used circumferentially to incise the vagina at the level of the reflection on the [facility] ring.  Once the uterus and cervix were amputated, cautery was used to insure hemostasis of the cuff vessels.  The two needles were removed through the vagina.  The uterus was needed to be bivalved to be removed- this was done with the monopolar cautery.  The uterus was grasped and pulled several times with the jacobsons tenaculum but the uterus was so soft that it essentially came off with pieces of the uterus.  My hand then was used to get better purchase of the uterus through the vagina and I was finally able to remove the rest of the uterus in one piece.  During the attempts at removal, the rectal epiploica were abraided on the R side of the pelvis and the posterior vagina was lacerated to about 2 cm across and superficially.  The abrasion of the epiploica was hemostatic and not involving any structures.  The vaginal laceration was bleeding and repaired with 3 figure of eight stitches.  I returned to the abdomen after changing and let the gas down to insure hemostasi. Once hemostasis was achieved, the instruments were changed to the mega needle driver and mega suture cut needle driver and the cuff was closed with a running stitches of 0-vicryl V loc.    Cautery was used to ensure hemostasis of the right pedicles very superficially.  The ureters were peristalsing bilaterally well and very lateral to  the areas of operation.    The Robot was then undocked and I scrubbed back in.  The needle was removed and Bupivicaine was introduced into the pelvis.  Arista was placed over all cut surfaces to insure any small bleeders would be hemostatic. The fascia of the 12 mm trocar was closed with a figure of 8 stitch of 0 vicryl.  The skin incisions were closed with subcuticular stitches of 3-0 vicryl Rapide and Dermabond.  All instruments were removed from the vagina and cystoscopy performed, revealing an intact bladder and vigourous spill of urine from each ureteral orifice, the bladder dome intact and the cystotomy intact.  The cystoscope was removed and the patient taken to the recovery room in stable condition.  Pansie Guggisberg A

## 2015-10-12 ENCOUNTER — Encounter (HOSPITAL_COMMUNITY): Payer: Self-pay | Admitting: Obstetrics and Gynecology

## 2015-10-12 DIAGNOSIS — Z86718 Personal history of other venous thrombosis and embolism: Secondary | ICD-10-CM | POA: Diagnosis not present

## 2015-10-12 DIAGNOSIS — K219 Gastro-esophageal reflux disease without esophagitis: Secondary | ICD-10-CM | POA: Diagnosis not present

## 2015-10-12 DIAGNOSIS — N920 Excessive and frequent menstruation with regular cycle: Secondary | ICD-10-CM | POA: Diagnosis not present

## 2015-10-12 DIAGNOSIS — D251 Intramural leiomyoma of uterus: Secondary | ICD-10-CM | POA: Diagnosis not present

## 2015-10-12 DIAGNOSIS — N840 Polyp of corpus uteri: Secondary | ICD-10-CM | POA: Diagnosis not present

## 2015-10-12 DIAGNOSIS — N9971 Accidental puncture and laceration of a genitourinary system organ or structure during a genitourinary system procedure: Secondary | ICD-10-CM | POA: Diagnosis not present

## 2015-10-12 DIAGNOSIS — N800 Endometriosis of uterus: Secondary | ICD-10-CM | POA: Diagnosis not present

## 2015-10-12 LAB — CBC
HEMATOCRIT: 36.5 % (ref 36.0–46.0)
HEMOGLOBIN: 12.2 g/dL (ref 12.0–15.0)
MCH: 26.5 pg (ref 26.0–34.0)
MCHC: 33.4 g/dL (ref 30.0–36.0)
MCV: 79.2 fL (ref 78.0–100.0)
Platelets: 226 10*3/uL (ref 150–400)
RBC: 4.61 MIL/uL (ref 3.87–5.11)
RDW: 15.9 % — AB (ref 11.5–15.5)
WBC: 11.3 10*3/uL — ABNORMAL HIGH (ref 4.0–10.5)

## 2015-10-12 LAB — COMPREHENSIVE METABOLIC PANEL
ALK PHOS: 28 U/L — AB (ref 38–126)
ALT: 23 U/L (ref 14–54)
ANION GAP: 6 (ref 5–15)
AST: 31 U/L (ref 15–41)
Albumin: 3 g/dL — ABNORMAL LOW (ref 3.5–5.0)
BILIRUBIN TOTAL: 1.6 mg/dL — AB (ref 0.3–1.2)
BUN: 8 mg/dL (ref 6–20)
CALCIUM: 8.2 mg/dL — AB (ref 8.9–10.3)
CO2: 24 mmol/L (ref 22–32)
Chloride: 107 mmol/L (ref 101–111)
Creatinine, Ser: 0.72 mg/dL (ref 0.44–1.00)
Glucose, Bld: 136 mg/dL — ABNORMAL HIGH (ref 65–99)
POTASSIUM: 4.1 mmol/L (ref 3.5–5.1)
Sodium: 137 mmol/L (ref 135–145)
TOTAL PROTEIN: 6.2 g/dL — AB (ref 6.5–8.1)

## 2015-10-12 MED ORDER — CEFUROXIME AXETIL 250 MG PO TABS: 250.0000 mg | ORAL_TABLET | Freq: Two times a day (BID) | ORAL | 0 refills | Status: AC

## 2015-10-12 MED ORDER — OXYCODONE-ACETAMINOPHEN 5-325 MG PO TABS: 1.0000 | ORAL_TABLET | ORAL | 0 refills | Status: DC | PRN

## 2015-10-12 NOTE — Progress Notes (Signed)
Patient is eating, ambulating, not voiding- catheter to remain in.  Pain control is good.  BP 98/64 (BP Location: Right Arm)   Pulse 90   Temp 98.7 F (37.1 C) (Oral)   Resp 18   SpO2 96%   lungs:  clear to auscultation cor:    RRR Abdomen:  soft, appropriate tenderness, incisions intact and without erythema or exudate. ex:    no cords   Results for orders placed or performed during the hospital encounter of 10/11/15 (from the past 24 hour(s))  Creatinine, serum     Status: None   Collection Time: 10/11/15  1:54 PM  Result Value Ref Range   Creatinine, Ser 0.84 0.44 - 1.00 mg/dL   GFR calc non Af Amer >60 >60 mL/min   GFR calc Af Amer >60 >60 mL/min  CBC     Status: Abnormal   Collection Time: 10/12/15  5:18 AM  Result Value Ref Range   WBC 11.3 (H) 4.0 - 10.5 K/uL   RBC 4.61 3.87 - 5.11 MIL/uL   Hemoglobin 12.2 12.0 - 15.0 g/dL   HCT 36.5 36.0 - 46.0 %   MCV 79.2 78.0 - 100.0 fL   MCH 26.5 26.0 - 34.0 pg   MCHC 33.4 30.0 - 36.0 g/dL   RDW 15.9 (H) 11.5 - 15.5 %   Platelets 226 150 - 400 K/uL    A/P  Routine care with two week with catheter for cystotomy.  Expect d/c per plan.  CMP pending.

## 2015-10-12 NOTE — Plan of Care (Signed)
Problem: Bowel/Gastric: Goal: Gastrointestinal status for postoperative course will improve Outcome: Completed/Met Date Met: 41/99/14 No GI complications, audible bowel sounds.  Problem: Education: Goal: Knowledge of the prescribed therapeutic regimen will improve Outcome: Completed/Met Date Met: 10/12/15 Has adequate understanding of post op course and caring for herself at home.  Goal: Understanding of sexual limitations or changes related to disease process or condition will improve Outcome: Completed/Met Date Met: 10/12/15 Sexual limitations were reviewed by Dr. Philis Pique when nursing was at bedside. Pt understands. Nursing did not review.   Problem: Skin Integrity: Goal: Demonstration of wound healing without infection will improve Outcome: Completed/Met Date Met: 10/12/15 Incisions look appropriate, pt understands how to monitor them and reasons to call her MD.   Problem: Urinary Elimination: Goal: Ability to reestablish a normal urinary elimination pattern will improve Outcome: Not Applicable Date Met: 44/58/48 Pt has a foley catheter in place at time of d/c per MD order. Pt to follow up for removal. Pt understands how to care for this. Marry Guan

## 2015-10-12 NOTE — Progress Notes (Signed)
Pt verbalizes understanding of d/c instructions, medications, follow up appts, when to seek medical attention, belongings policy. Pt was given a copy of d/c instructions and her prescriptions prior to leaving. Pt was encouraged to check the room thoroughly for her belongings. Pts husband is with her and will be driving her home. Marry Guan

## 2015-10-12 NOTE — Discharge Instructions (Signed)
Keep Foley in place- give patient leg bag and show how to change bags.  Instruct on care.   

## 2015-10-12 NOTE — Discharge Summary (Signed)
Physician Discharge Summary  Patient ID: Peggy [name] MRN: [id number] DOB/AGE: 17-Sep-1975 40 y.o.  Admit date: 10/11/2015 Discharge date: 10/12/2015  Admission Diagnoses:menorrhagia, fibroids  Discharge Diagnoses: same plus adenomyosis, cystotomy Active Problems:   Postoperative state   Discharged Condition: good  Hospital Course: Robotic TLH with salpingectomies complicated by incidental cystotomy; cystotomy repaired and cystoscopy done.  Consults: None  Significant Diagnostic Studies: labs:  Results for orders placed or performed during the hospital encounter of 10/11/15 (from the past 24 hour(s))  Creatinine, serum     Status: None   Collection Time: 10/11/15  1:54 PM  Result Value Ref Range   Creatinine, Ser 0.84 0.44 - 1.00 mg/dL   GFR calc non Af Amer >60 >60 mL/min   GFR calc Af Amer >60 >60 mL/min  CBC     Status: Abnormal   Collection Time: 10/12/15  5:18 AM  Result Value Ref Range   WBC 11.3 (H) 4.0 - 10.5 K/uL   RBC 4.61 3.87 - 5.11 MIL/uL   Hemoglobin 12.2 12.0 - 15.0 g/dL   HCT 36.5 36.0 - 46.0 %   MCV 79.2 78.0 - 100.0 fL   MCH 26.5 26.0 - 34.0 pg   MCHC 33.4 30.0 - 36.0 g/dL   RDW 15.9 (H) 11.5 - 15.5 %   Platelets 226 150 - 400 K/uL    Treatments: surgery:  Robotic TLH with salpingectomies complicated by incidental cystotomy; cystotomy repaired and cystoscopy done.  Discharge Exam: Blood pressure 98/64, pulse 90, temperature 98.7 F (37.1 C), temperature source Oral, resp. rate 18, SpO2 96 %.   Disposition: 01-Home or Self Care  Discharge Instructions    Call MD for:  temperature >100.4    Complete by:  As directed    Diet - low sodium heart healthy    Complete by:  As directed    Discharge instructions    Complete by:  As directed    No driving on narcotics, no sexual activity for 2 weeks.   Increase activity slowly    Complete by:  As directed    May shower / Bathe    Complete by:  As directed    Shower, no bath for 2 weeks.   Remove  dressing in 24 hours    Complete by:  As directed    Sexual Activity Restrictions    Complete by:  As directed    No sexual activity for 2 weeks.       Medication List    STOP taking these medications   megestrol 40 MG tablet Commonly known as:  MEGACE   predniSONE 10 MG (21) Tbpk tablet Commonly known as:  STERAPRED UNI-PAK 21 TAB     TAKE these medications   cefUROXime 250 MG tablet Commonly known as:  CEFTIN Take 1 tablet (250 mg total) by mouth 2 (two) times daily with a meal.   ferrous sulfate 325 (65 FE) MG EC tablet Take 1 tablet (325 mg total) by mouth 2 (two) times daily.   oxyCODONE-acetaminophen 5-325 MG tablet Commonly known as:  PERCOCET/ROXICET Take 1-2 tablets by mouth every 4 (four) hours as needed for severe pain (moderate to severe pain (when tolerating fluids)).      Follow-up Information    Jeannelle Wiens A, MD Follow up in 10 day(s).   Specialty:  Obstetrics and Gynecology Contact information: [address] [address] [phone]           Signed: Shandale Malak A 10/12/2015, 7:36 AM

## 2015-10-26 ENCOUNTER — Other Ambulatory Visit (HOSPITAL_COMMUNITY): Payer: Self-pay | Admitting: Obstetrics and Gynecology

## 2015-10-26 DIAGNOSIS — Z9350 Unspecified cystostomy status: Secondary | ICD-10-CM

## 2015-10-27 ENCOUNTER — Ambulatory Visit (HOSPITAL_COMMUNITY): Payer: BLUE CROSS/BLUE SHIELD

## 2015-10-27 ENCOUNTER — Ambulatory Visit (HOSPITAL_COMMUNITY)
Admission: RE | Admit: 2015-10-27 | Discharge: 2015-10-27 | Disposition: A | Discharge status: Home / Self Care | Payer: BLUE CROSS/BLUE SHIELD | Source: Ambulatory Visit | Attending: Obstetrics and Gynecology | Admitting: Obstetrics and Gynecology

## 2015-10-27 DIAGNOSIS — Z9350 Unspecified cystostomy status: Secondary | ICD-10-CM | POA: Insufficient documentation

## 2015-10-27 DIAGNOSIS — N3289 Other specified disorders of bladder: Secondary | ICD-10-CM | POA: Diagnosis not present

## 2015-10-27 MED ORDER — IOTHALAMATE MEGLUMINE 17.2 % UR SOLN
250.0000 mL | Freq: Once | URETHRAL | Status: AC | PRN
  Administered 2015-10-27: 250 mL via INTRAVESICAL

## 2015-12-20 NOTE — Addendum Note (Signed)
Encounter addended by: Gerhard Munch on: 12/20/2015  1:09 PM<BR>    Actions taken: Imaging Exam ended

## 2016-01-18 NOTE — Addendum Note (Signed)
Encounter addended by: Tommas Olp on: 01/18/2016 12:47 PM<BR>    Actions taken: Imaging Exam ended

## 2016-01-18 NOTE — Addendum Note (Signed)
Encounter addended by: Tommas Olp on: 01/18/2016 12:46 PM<BR>    Actions taken: Imaging Exam ended, Charge Capture section accepted

## 2016-03-20 ENCOUNTER — Emergency Department (HOSPITAL_COMMUNITY)
Admission: EM | Admit: 2016-03-20 | Discharge: 2016-03-21 | Disposition: A | Discharge status: Home / Self Care | Payer: Commercial Managed Care - HMO | Attending: Emergency Medicine | Admitting: Emergency Medicine

## 2016-03-20 ENCOUNTER — Emergency Department (HOSPITAL_COMMUNITY): Payer: Commercial Managed Care - HMO

## 2016-03-20 DIAGNOSIS — R202 Paresthesia of skin: Secondary | ICD-10-CM

## 2016-03-20 DIAGNOSIS — R4701 Aphasia: Secondary | ICD-10-CM | POA: Diagnosis not present

## 2016-03-20 DIAGNOSIS — G43809 Other migraine, not intractable, without status migrainosus: Secondary | ICD-10-CM | POA: Insufficient documentation

## 2016-03-20 DIAGNOSIS — G43109 Migraine with aura, not intractable, without status migrainosus: Secondary | ICD-10-CM

## 2016-03-20 DIAGNOSIS — R791 Abnormal coagulation profile: Secondary | ICD-10-CM | POA: Diagnosis not present

## 2016-03-20 DIAGNOSIS — Z79899 Other long term (current) drug therapy: Secondary | ICD-10-CM | POA: Diagnosis not present

## 2016-03-20 DIAGNOSIS — R251 Tremor, unspecified: Secondary | ICD-10-CM | POA: Insufficient documentation

## 2016-03-20 DIAGNOSIS — R531 Weakness: Secondary | ICD-10-CM | POA: Insufficient documentation

## 2016-03-20 LAB — I-STAT TROPONIN, ED: TROPONIN I, POC: 0 ng/mL (ref 0.00–0.08)

## 2016-03-20 LAB — COMPREHENSIVE METABOLIC PANEL
ALT: 30 U/L (ref 14–54)
ANION GAP: 8 (ref 5–15)
AST: 28 U/L (ref 15–41)
Albumin: 4.4 g/dL (ref 3.5–5.0)
Alkaline Phosphatase: 52 U/L (ref 38–126)
BILIRUBIN TOTAL: 0.9 mg/dL (ref 0.3–1.2)
BUN: 12 mg/dL (ref 6–20)
CO2: 26 mmol/L (ref 22–32)
Calcium: 9.6 mg/dL (ref 8.9–10.3)
Chloride: 104 mmol/L (ref 101–111)
Creatinine, Ser: 0.73 mg/dL (ref 0.44–1.00)
GFR calc Af Amer: >60 mL/min (ref >60)
Glucose, Bld: 84 mg/dL (ref 65–99)
POTASSIUM: 3.7 mmol/L (ref 3.5–5.1)
Sodium: 138 mmol/L (ref 135–145)
TOTAL PROTEIN: 8.5 g/dL — AB (ref 6.5–8.1)

## 2016-03-20 LAB — DIFFERENTIAL
Basophils Absolute: 0 10*3/uL (ref 0.0–0.1)
Basophils Relative: 0 %
EOS ABS: 0.1 10*3/uL (ref 0.0–0.7)
Eosinophils Relative: 1 %
LYMPHS ABS: 3 10*3/uL (ref 0.7–4.0)
Lymphocytes Relative: 30 %
MONO ABS: 0.8 10*3/uL (ref 0.1–1.0)
MONOS PCT: 8 %
Neutro Abs: 6.1 10*3/uL (ref 1.7–7.7)
Neutrophils Relative %: 61 %

## 2016-03-20 LAB — PROTIME-INR
INR: 1.07
Prothrombin Time: 14 seconds (ref 11.4–15.2)

## 2016-03-20 LAB — CBC
HEMATOCRIT: 45.6 % (ref 36.0–46.0)
HEMOGLOBIN: 15.6 g/dL — AB (ref 12.0–15.0)
MCH: 28.2 pg (ref 26.0–34.0)
MCHC: 34.2 g/dL (ref 30.0–36.0)
MCV: 82.5 fL (ref 78.0–100.0)
Platelets: 264 10*3/uL (ref 150–400)
RBC: 5.53 MIL/uL — ABNORMAL HIGH (ref 3.87–5.11)
RDW: 14.6 % (ref 11.5–15.5)
WBC: 10 10*3/uL (ref 4.0–10.5)

## 2016-03-20 LAB — I-STAT CHEM 8, ED
BUN: 11 mg/dL (ref 6–20)
CALCIUM ION: 1.17 mmol/L (ref 1.15–1.40)
Chloride: 103 mmol/L (ref 101–111)
Creatinine, Ser: 0.7 mg/dL (ref 0.44–1.00)
Glucose, Bld: 80 mg/dL (ref 65–99)
HCT: 49 % — ABNORMAL HIGH (ref 36.0–46.0)
HEMOGLOBIN: 16.7 g/dL — AB (ref 12.0–15.0)
POTASSIUM: 3.8 mmol/L (ref 3.5–5.1)
SODIUM: 141 mmol/L (ref 135–145)
TCO2: 26 mmol/L (ref 0–100)

## 2016-03-20 LAB — RAPID URINE DRUG SCREEN, HOSP PERFORMED
Amphetamines: NOT DETECTED
BARBITURATES: NOT DETECTED
Benzodiazepines: NOT DETECTED
COCAINE: NOT DETECTED
OPIATES: NOT DETECTED
Tetrahydrocannabinol: NOT DETECTED

## 2016-03-20 LAB — URINALYSIS, ROUTINE W REFLEX MICROSCOPIC
Bilirubin Urine: NEGATIVE
GLUCOSE, UA: NEGATIVE mg/dL
Hgb urine dipstick: NEGATIVE
Ketones, ur: 5 mg/dL — AB
Leukocytes, UA: NEGATIVE
Nitrite: NEGATIVE
PROTEIN: NEGATIVE mg/dL
Specific Gravity, Urine: 1.023 (ref 1.005–1.030)
pH: 5 (ref 5.0–8.0)

## 2016-03-20 LAB — ETHANOL

## 2016-03-20 LAB — APTT: aPTT: 34 seconds (ref 24–36)

## 2016-03-20 LAB — CBG MONITORING, ED: Glucose-Capillary: 82 mg/dL (ref 65–99)

## 2016-03-20 MED ORDER — DEXAMETHASONE SODIUM PHOSPHATE 10 MG/ML IJ SOLN
10.0000 mg | Freq: Once | INTRAMUSCULAR | Status: AC
  Administered 2016-03-20: 10 mg via INTRAVENOUS
  Filled 2016-03-20: qty 1

## 2016-03-20 MED ORDER — PROCHLORPERAZINE EDISYLATE 5 MG/ML IJ SOLN
10.0000 mg | Freq: Once | INTRAMUSCULAR | Status: AC
  Administered 2016-03-20: 10 mg via INTRAVENOUS
  Filled 2016-03-20: qty 2

## 2016-03-20 MED ORDER — DIPHENHYDRAMINE HCL 50 MG/ML IJ SOLN
25.0000 mg | Freq: Once | INTRAMUSCULAR | Status: AC
  Administered 2016-03-20: 25 mg via INTRAVENOUS
  Filled 2016-03-20: qty 1

## 2016-03-20 NOTE — ED Triage Notes (Signed)
Pt states that she has been evaluated for some kind of neuromuscular disorder that has brought inconclusive results. Today she had an episode of tremors/ shaking full body that she remembers. This came after the onset of a migraine. Alert and oriented.

## 2016-03-20 NOTE — ED Notes (Signed)
Bed: WA06 Expected date:  Expected time:  Means of arrival:  Comments: EMS/seizure 

## 2016-03-20 NOTE — ED Notes (Signed)
ED Provider at bedside. 

## 2016-03-20 NOTE — ED Notes (Addendum)
MRI called. Pt remains on list for scan and will be retrieved shortly.

## 2016-03-20 NOTE — ED Notes (Signed)
Pt transported to MRI 

## 2016-03-20 NOTE — ED Notes (Signed)
ED Provider at bedside explaining risks and benefits of TPA.

## 2016-03-20 NOTE — ED Provider Notes (Signed)
[facility] DEPT Provider Note   CSN: [id number] Arrival date & time: 03/20/16  1821     History   Chief Complaint Chief Complaint  Patient presents with  . Tremors    HPI Peggy [name] is a 41 y.o. female.  HPI  Presents with concern for tremors, headache. Reports started at 5PM. Similar episodes to this in the past, was tested for MS, then they thought it was complicated migraines. Reports tingling on right side of body which is new.  Usually gets internally dizzy, but does not usually have tingling. Usually gets that sensation prior to the tremors.  Normally the tremors go away in 15 minutes, but this time they stayed. Usually tremors hands and feet but this time tremors feel like whole body.  4/10 headache. Speech is also changed since this occurred which is new in comparison to prior episodes. Denies noticing and weakness.  Has seen Neurology at [facility].  Past Medical History:  Diagnosis Date  . Anemia   . Arthritis    foot  . DVT (deep venous thrombosis) ([place])   . Fibroid uterus   . Fibromyalgia   . GERD (gastroesophageal reflux disease)   . Headache    Migraines  . Infertility management     Patient Active Problem List   Diagnosis Date Noted  . Postoperative state 10/11/2015  . Chest pain 01/20/2015  . Rigidity of first metatarsophalangeal joint of right foot 09/06/2014  . Obesity 08/26/2014  . Bilateral low back pain without sciatica 08/09/2014  . Complicated migraine 37/62/8315  . Right leg DVT ([place]) 08/09/2014  . Anemia due to blood loss, acute 05/03/2014  . Fibromyalgia 07/06/2010    Past Surgical History:  Procedure Laterality Date  . CARDIAC CATHETERIZATION  approx 8 years ago   for chest pain,   . CESAREAN SECTION    . CESAREAN SECTION N/A 02/29/2012   Procedure: Repeat CESAREAN SECTION of baby boy at [place] 8/8;  Surgeon: Daria Pastures, MD;  Location: [facility];  Service: Obstetrics;  Laterality: N/A;  . CYSTOSCOPY  10/11/2015   Procedure:  CYSTOSCOPY;  Surgeon: Bobbye Charleston, MD;  Location: [facility];  Service: Gynecology;;  . FOOT SURGERY     right foot  . LAPAROSCOPIC BILATERAL SALPINGECTOMY  10/11/2015   Procedure: LAPAROSCOPIC BILATERAL SALPINGECTOMY;  Surgeon: Bobbye Charleston, MD;  Location: [facility];  Service: Gynecology;;  . ROBOTIC ASSISTED TOTAL HYSTERECTOMY N/A 10/11/2015   Procedure: ROBOTIC ASSISTED TOTAL HYSTERECTOMY, Laparoscopic Repair Cystotomy;  Surgeon: Bobbye Charleston, MD;  Location: [facility];  Service: Gynecology;  Laterality: N/A;  . TONSILLECTOMY    . TONSILLECTOMY      OB History    Gravida Para Term Preterm AB Living   2 2 1 1   3    SAB TAB Ectopic Multiple Live Births           2       Home Medications    Prior to Admission medications   Medication Sig Start Date End Date Taking? Authorizing Provider  ferrous sulfate 325 (65 FE) MG EC tablet Take 1 tablet (325 mg total) by mouth 2 (two) times daily. Patient not taking: Reported on 09/28/2015 09/07/13   Denita Lung, MD  oxyCODONE-acetaminophen (PERCOCET/ROXICET) 5-325 MG tablet Take 1-2 tablets by mouth every 4 (four) hours as needed for severe pain (moderate to severe pain (when tolerating fluids)). Patient not taking: Reported on 03/20/2016 10/12/15   Bobbye Charleston, MD    Family History Family History  Problem Relation Age of Onset  . Diabetes Maternal Grandmother   . Heart disease Maternal Grandmother   . Heart disease Maternal Grandfather     Social History Social History  Substance Use Topics  . Smoking status: Never Smoker  . Smokeless tobacco: Never Used  . Alcohol use Yes     Comment: 1-2 drinks per month.     Allergies   Patient has no known allergies.   Review of Systems Review of Systems  Constitutional: Negative for fever.  HENT: Negative for sore throat.   Eyes: Positive for visual disturbance (left eye, chronic).  Respiratory: Negative for cough and shortness of breath.   Cardiovascular: Negative for chest  pain.  Gastrointestinal: Positive for nausea. Negative for abdominal pain and vomiting.  Genitourinary: Positive for frequency. Negative for difficulty urinating.  Musculoskeletal: Negative for back pain and neck pain.  Skin: Negative for rash.  Neurological: Positive for speech difficulty (denies but noted on exam then acknowledges this), numbness (tingling right side) and headaches. Negative for syncope, facial asymmetry and weakness (denies but noted on exam).     Physical Exam Updated Vital Signs BP 125/93   Pulse 67   Temp 98.9 F (37.2 C)   Resp 14   LMP 04/29/2014 (Exact Date)   SpO2 94%   Physical Exam  Constitutional: She is oriented to person, place, and time. She appears well-developed and well-nourished. No distress.  HENT:  Head: Normocephalic and atraumatic.  Eyes: Conjunctivae and EOM are normal.  Neck: Normal range of motion.  Cardiovascular: Normal rate, regular rhythm, normal heart sounds and intact distal pulses.  Exam reveals no gallop and no friction rub.   No murmur heard. Pulmonary/Chest: Effort normal and breath sounds normal. No respiratory distress. She has no wheezes. She has no rales.  Abdominal: Soft. She exhibits no distension. There is no tenderness. There is no guarding.  Musculoskeletal: She exhibits no edema or tenderness.  Neurological: She is alert and oriented to person, place, and time. She displays tremor. A sensory deficit (right sided altered sensation) is present. No cranial nerve deficit (with exception for right facial sensation).  Right leg with slight drift Limb ataxia, right worse than left. Unable to do heel to shin with right leg, left leg able to complete with tremor. Able to complete finger to nose bilaterally with tremor.  Speech is slow, fluent at times, at times with pauses between words.  Tremor present bilateral upper, lower extremities, increasing with intention, head with tremor as well that improves with rest.  Skin: Skin is  warm and dry. No rash noted. She is not diaphoretic. No erythema.  Nursing note and vitals reviewed.    ED Treatments / Results  Labs (all labs ordered are listed, but only abnormal results are displayed) Labs Reviewed  CBC - Abnormal; Notable for the following:       Result Value   RBC 5.53 (*)    Hemoglobin 15.6 (*)    All other components within normal limits  COMPREHENSIVE METABOLIC PANEL - Abnormal; Notable for the following:    Total Protein 8.5 (*)    All other components within normal limits  URINALYSIS, ROUTINE W REFLEX MICROSCOPIC - Abnormal; Notable for the following:    APPearance HAZY (*)    Ketones, ur 5 (*)    All other components within normal limits  I-STAT CHEM 8, ED - Abnormal; Notable for the following:    Hemoglobin 16.7 (*)    HCT 49.0 (*)  All other components within normal limits  ETHANOL  PROTIME-INR  APTT  DIFFERENTIAL  RAPID URINE DRUG SCREEN, HOSP PERFORMED  I-STAT TROPOININ, ED  CBG MONITORING, ED    EKG  EKG Interpretation None       Radiology Mr Brain Wo Contrast  Result Date: 03/21/2016 CLINICAL DATA:  Initial evaluation for acute tremors, right-sided weakness, tingling, speech difficulty. EXAM: MRI HEAD WITHOUT CONTRAST TECHNIQUE: Multiplanar, multiecho pulse sequences of the brain and surrounding structures were obtained without intravenous contrast. COMPARISON:  Prior CT from 03/20/2016. FINDINGS: Brain: Cerebral volume within normal limits. Few scattered minimal patchy T2/FLAIR signal abnormality seen within the periventricular white matter, nonspecific, but most likely related to chronic small vessel ischemic disease, felt to be within normal limits for age. No abnormal foci of restricted diffusion to suggest acute or subacute ischemia. Gray-white matter differentiation maintained. No evidence for acute or chronic intracranial hemorrhage. No encephalomalacia to suggest chronic infarction. No mass lesion, midline shift, or mass effect.  Ventricles normal in size without evidence for hydrocephalus. No extra-axial fluid collection. Major dural sinuses are grossly patent. Thin section imaging through the temporal lobes demonstrates a normal appearance and signal intensity of the hippocampi bilaterally. Pituitary gland suprasellar region within normal limits. Vascular: Major intracranial vascular flow voids are well maintained. Vertebrobasilar system is diminutive. Skull and upper cervical spine: Craniocervical junction normal. Visualized upper cervical spine unremarkable. Bone marrow signal intensity within normal limits. No scalp soft tissue abnormality. Sinuses/Orbits: Globes and orbital soft tissues within normal limits. Paranasal sinuses are clear. No mastoid effusion. Inner ear structures normal. Other: No other significant finding. IMPRESSION: Normal brain MRI. No acute intracranial infarct or other process identified. Electronically Signed   By: Jeannine Boga M.D.   On: 03/21/2016 00:29   Ct Head Code Stroke Wo Contrast  Result Date: 03/21/2016 CLINICAL DATA:  Code stroke. Initial evaluation for right-sided weakness and tingling. EXAM: CT HEAD WITHOUT CONTRAST TECHNIQUE: Contiguous axial images were obtained from the base of the skull through the vertex without intravenous contrast. COMPARISON:  Comparison made we MRI of the brain, performed later on the same day at 23:12 FINDINGS: Brain: Cerebral volume normal. No acute intracranial hemorrhage. No evidence for acute large vessel territory infarct. No mass lesion, midline shift or mass effect. No hydrocephalus. No extra-axial fluid collection. Vascular: No hyperdense vessel. Skull: Scalp soft tissues within normal limits.  Calvarium intact. Sinuses/Orbits: Globes and orbital soft tissues within normal limits. Paranasal sinuses are clear. No mastoid effusion. Other: None ASPECTS ([place] Stroke Program Early CT Score) - Ganglionic level infarction (caudate, lentiform nuclei, internal  capsule, insula, M1-M3 cortex): 7 - Supraganglionic infarction (M4-M6 cortex): 3 Total score (0-10 with 10 being normal): 10 IMPRESSION: 1. Normal head CT. No acute intracranial infarct or other process identified. 2. ASPECTS is 10 Please note that this study was not available for review and interpretation until approximately 1:00 am on 03/21/2016 due to technical error (study was not sent to the list for interpretation, and radiology [facility] was not notified of an active stroke alert). At the time of this dictation, a follow-up brain MRI has already been performed demonstrating no acute infarct or other process. Electronically Signed   By: Jeannine Boga M.D.   On: 03/21/2016 01:04    Procedures Procedures (including critical care time)  Medications Ordered in ED Medications  prochlorperazine (COMPAZINE) injection 10 mg (10 mg Intravenous Given 03/20/16 2030)  diphenhydrAMINE (BENADRYL) injection 25 mg (25 mg Intravenous Given 03/20/16 2030)  dexamethasone (DECADRON)  injection 10 mg (10 mg Intravenous Given 03/20/16 2030)     Initial Impression / Assessment and Plan / ED Course  I have reviewed the triage vital signs and the nursing notes.  Pertinent labs & imaging results that were available during my care of the patient were reviewed by me and considered in my medical decision making (see chart for details).     41yo female with history of complicated mibraines with associated tremors, DVT, fibromyalgia, presents with concern for tremors with onset at 5PM.  Initially reports tremors and headache similar to past episodes, however on my history reports right sided tingling and on exam noted to have mild right sided weakness. Called Code Stroke, with technical NIH 5. Discussed with Dr. Cheral Marker. Overall given history of complicated migraines with similar symptoms, combination of tremors/ataxia with right sided weakness/numbness, speech problem do not localize to just one location.  Discussed with  patient options such as transfer to [facility] risks/possible benefits versus treating symptoms as complicated migraine and obtaining MRI to further evaluate.  Patient wishes to stay at [facility].  Headache and all symptoms resolved with decadron, compazine, benadryl.  MRI shows no sign of CVA. Likely complicated migraine, possible stress induced. Patient discharged in stable condition with understanding of reasons to return.   Final Clinical Impressions(s) / ED Diagnoses   Final diagnoses:  Tingling  Complicated migraine  Tremor  Right sided weakness  Aphasia    New Prescriptions Discharge Medication List as of 03/21/2016  1:21 AM       Gareth Morgan, MD 03/21/16 1407

## 2016-03-20 NOTE — ED Notes (Signed)
CT tech notified of need for stat CT.

## 2016-03-20 NOTE — ED Notes (Signed)
Pt's speech, headache and R leg weakness largely resolved. MD made aware.

## 2016-03-21 NOTE — ED Notes (Signed)
Patient was alert, oriented and stable upon discharge. RN went over AVS and patient had no further questions.  

## 2016-03-21 NOTE — ED Notes (Signed)
ED Provider at bedside. 

## 2016-04-02 ENCOUNTER — Ambulatory Visit (INDEPENDENT_AMBULATORY_CARE_PROVIDER_SITE_OTHER): Payer: Commercial Managed Care - HMO | Admitting: Sports Medicine

## 2016-04-02 DIAGNOSIS — M545 Low back pain, unspecified: Secondary | ICD-10-CM

## 2016-04-02 DIAGNOSIS — G8929 Other chronic pain: Secondary | ICD-10-CM | POA: Diagnosis not present

## 2016-04-02 MED ORDER — MELOXICAM 15 MG PO TABS: ORAL_TABLET | ORAL | 3 refills | Status: DC

## 2016-04-02 NOTE — Progress Notes (Signed)
  Subjective:    CC: Follow-up  HPI: This is a pleasant 41 year old female, several years ago I treated her for her axial low back pain, initially she did well with physical therapy, NSAIDs. More recently she's had a recurrence of pain, nonspecific, axial and describes as a pressure with some radiation to the right buttock but not past the knee, nothing overtly radicular, no bowel or bladder dysfunction, saddle numbness, no constitutional symptoms, no trauma.  Past medical history:  Negative.  See flowsheet/record as well for more information.  Surgical history: Negative.  See flowsheet/record as well for more information.  Family history: Negative.  See flowsheet/record as well for more information.  Social history: Negative.  See flowsheet/record as well for more information.  Allergies, and medications have been entered into the medical record, reviewed, and no changes needed.   Review of Systems: No fevers, chills, night sweats, weight loss, chest pain, or shortness of breath.   Objective:    General: Well Developed, well nourished, and in no acute distress.  Neuro: Alert and oriented x3, extra-ocular muscles intact, sensation grossly intact.  HEENT: Normocephalic, atraumatic, pupils equal round reactive to light, neck supple, no masses, no lymphadenopathy, thyroid nonpalpable.  Skin: Warm and dry, no rashes. Cardiac: Regular rate and rhythm, no murmurs rubs or gallops, no lower extremity edema.  Respiratory: Clear to auscultation bilaterally. Not using accessory muscles, speaking in full sentences. Back Exam:  Inspection: Unremarkable  Motion: Flexion 45 deg, Extension 45 deg, Side Bending to 45 deg bilaterally,  Rotation to 45 deg bilaterally  SLR laying: Negative  XSLR laying: Negative  Palpable tenderness: Nonspecific tenderness over the left paralumbar muscles as well as the sacroiliac joints. FABER: negative. Sensory change: Gross sensation intact to all lumbar and sacral  dermatomes.  Reflexes: 2+ at both patellar tendons, 2+ at achilles tendons, Babinski's downgoing.  Strength at foot  Plantar-flexion: 5/5 Dorsi-flexion: 5/5 Eversion: 5/5 Inversion: 5/5  Leg strength  Quad: 5/5 Hamstring: 5/5 Hip flexor: 5/5 Hip abductors: 5/5  Gait unremarkable.  Impression and Recommendations:    Bilateral low back pain without sciatica Axial low back pain, nonspecific. She will get back with her physical therapy exercises, there have been years of persistent symptoms now, so I am going to go ahead and get an MRI. Restarting Mobic. Return to go over MRI results.

## 2016-04-02 NOTE — Assessment & Plan Note (Addendum)
Axial low back pain, nonspecific. She will get back with her physical therapy exercises, there have been years of persistent symptoms now, so I am going to go ahead and get an MRI. Restarting Mobic. Return to go over MRI results.

## 2016-05-07 ENCOUNTER — Ambulatory Visit (INDEPENDENT_AMBULATORY_CARE_PROVIDER_SITE_OTHER): Payer: Commercial Managed Care - HMO | Admitting: Family Medicine

## 2016-05-07 ENCOUNTER — Encounter: Payer: Self-pay | Admitting: Family Medicine

## 2016-05-07 VITALS — BP 120/70 | Wt 186.0 lb

## 2016-05-07 DIAGNOSIS — R519 Headache, unspecified: Secondary | ICD-10-CM

## 2016-05-07 DIAGNOSIS — R51 Headache: Secondary | ICD-10-CM | POA: Diagnosis not present

## 2016-05-07 NOTE — Progress Notes (Signed)
Subjective:     Patient ID: Peggy [name], female   DOB: 1975-06-21, 40 y.o.   MRN: [id number]  HPI Peggy [name] is a 41 year old female with a past medical history of fibromyalgia who presents today with concerns regarding headaches that have been going on everyday for the past 2 weeks. She reports these headaches last 4-5 hours, but when asked later on in the visit reports it occurs for 1 hour. She also was experiencing a headache during the visit and reported her pain was a 5 or 6 out of 10, but it went away in less than 30 minutes.   She reports the headache Is in the occipital area. The pain Is dull and she reports the area feels hot, numb, and tingling. She notes she has stuttering, trouble with word finding, vertigo, and tremors with some of these headaches. She also has nausea, light sensitivity, and right upper and lower extremity numbness and tingling. She reports no extra stress in her life, parasthesias, vomiting or vision changes with the headaches.  She normally only experiences headaches 1-2 times per month over the last year, but the past two weeks she's been having them everyday. Previously, she used ibuprofen as well as lying down in a dark room with a cold washcloth on her head to help alleviate her headaches, but lately this hasn't helped as much and the frequency of these headaches has increased over the past few weeks. In March, she went to the ED with a headache as well as trouble with word finding and tremors. Labs, CT/MRI of the head were all normal she was dosed with complicated migraine. She reports her symptoms were resolved with the cocktail that was given in the ED.  The patient reports she has seen neurology in the past and had an appointment with them this morning. The neurologist prescribed two new medications (rizatriptan and topiramate) and requested she come back in 3 months. She reports she would like to see a migraine specialist to have her headaches evaluated as  well.  Review of Systems Pertinent positives and negatives included in the subjective above    Objective:   Physical Exam Patient was alert and in no distress. Speech had some stuttering and pauses, but this varied throughout the visit and was inconsistent. Heart had a regular rate and rhythm and was without murmurs, rubs, or gallops.  Lungs were clear to auscultation. Cranial nerves I through XII were intact. EOMI. PERRLA DTRs and myotonic reflexes in the upper and lower extremities were normal. There was no clonus present. There was intact sensation on both sides of the body with the patient noting more right sided sensitivity on exam The strength on both the right and left lower and upper extremities was intact when the patient was giving full effort. Some giveway weakness was noted on the right upper and lower extremities. This was seen in grip strength as well.  Nose to finger testing was normal.    the medical record from [facility] was also reviewed similar history was obtained. Assessment and Plan:     1. Headache: Patient's headaches and associated symptoms could be due to psychological issues and stress. Her prior work-up in the Emergency Department in March was negative with a normal MRI and CT. We encouraged the patient to continue on her medications and try the topiramate and rizatriptan prescribed by her neurologist to see if this helps with her symptoms. We told her to follow-up with [place] in one month to  see how she's doing. We also encouraged her to continue following up with her current neurologist. We will reevaluate her symptoms in a month to see how she is doing. At the end of the interview I also discussed the possibility of this being psychological in origin. I removed the diagnosis of migraine from her record as I do not think she is truly having a migraine for probably more headache with psychological overlay  Patient was seen in conjunction with Dr. Redmond School. [place] Calix, MS3

## 2016-06-04 ENCOUNTER — Ambulatory Visit (INDEPENDENT_AMBULATORY_CARE_PROVIDER_SITE_OTHER): Payer: Commercial Managed Care - HMO | Admitting: Family Medicine

## 2016-06-04 ENCOUNTER — Encounter: Payer: Self-pay | Admitting: Family Medicine

## 2016-06-04 VITALS — BP 124/80 | HR 67 | Ht 67.5 in | Wt 182.0 lb

## 2016-06-04 DIAGNOSIS — M545 Low back pain: Secondary | ICD-10-CM | POA: Diagnosis not present

## 2016-06-04 DIAGNOSIS — Z86718 Personal history of other venous thrombosis and embolism: Secondary | ICD-10-CM

## 2016-06-04 DIAGNOSIS — G8929 Other chronic pain: Secondary | ICD-10-CM | POA: Diagnosis not present

## 2016-06-04 DIAGNOSIS — Z0000 Encounter for general adult medical examination without abnormal findings: Secondary | ICD-10-CM | POA: Diagnosis not present

## 2016-06-04 DIAGNOSIS — M797 Fibromyalgia: Secondary | ICD-10-CM | POA: Diagnosis not present

## 2016-06-04 DIAGNOSIS — J301 Allergic rhinitis due to pollen: Secondary | ICD-10-CM | POA: Diagnosis not present

## 2016-06-04 DIAGNOSIS — G43109 Migraine with aura, not intractable, without status migrainosus: Secondary | ICD-10-CM

## 2016-06-04 LAB — LIPID PANEL
CHOLESTEROL: 250 mg/dL — AB (ref <200)
HDL: 36 mg/dL — ABNORMAL LOW (ref >50)
LDL CALC: 154 mg/dL — AB (ref <100)
TRIGLYCERIDES: 302 mg/dL — AB (ref <150)
Total CHOL/HDL Ratio: 6.9 Ratio — ABNORMAL HIGH (ref <5.0)
VLDL: 60 mg/dL — ABNORMAL HIGH (ref <30)

## 2016-06-04 NOTE — Progress Notes (Signed)
Subjective:    Patient ID: Peggy [name], female    DOB: 11-04-1975, 41 y.o.   MRN: [id number]  HPI She is here for complete examination. She is being followed by neurology and does have underlying migraine with aura. Presently she is on Topamax and amitriptyline and has been given Maxalt if she does have a breakthrough. She seems be handling this well. She also is getting some insight into the stressful situations that she has been under causing her to have some neuropsychiatric symptoms. She now recognizes that stress does play a role in some of symptoms that she was having. She does have underlying fibromyalgia with occasional backaches but this seems to be under fairly good control. She has a previous history of DVT. She does have seasonal allergies and presently is on no medications. Her work and home life are going quite well. Family and social history as well as health maintenance and immunizations was reviewed. She did have a hysterectomy due to menorrhagia and fibroids.  Review of Systems  All other systems reviewed and are negative.      Objective:   Physical Exam BP 124/80   Pulse 67   Ht 5' 7.5" (1.715 m)   Wt 182 lb (82.6 kg)   LMP 04/29/2014 (Exact Date)   SpO2 97%   BMI 28.08 kg/m   General Appearance:    Alert, cooperative, no distress, appears stated age  Head:    Normocephalic, without obvious abnormality, atraumatic  Eyes:    PERRL, conjunctiva/corneas clear, EOM's intact, fundi    benign  Ears:    Normal TM's and external ear canals  Nose:   Nares normal, mucosa normal, no drainage or sinus   tenderness  Throat:   Lips, mucosa, and tongue normal; teeth and gums normal  Neck:   Supple, no lymphadenopathy;  thyroid:  no   enlargement/tenderness/nodules; no carotid   bruit or JVD     Lungs:     Clear to auscultation bilaterally without wheezes, rales or     ronchi; respirations unlabored      Heart:    Regular rate and rhythm, S1 and S2 normal, no murmur, rub  or gallop  Breast Exam:    Deferred to GYN  Abdomen:     Soft, non-tender, nondistended, normoactive bowel sounds,    no masses, no hepatosplenomegaly  Genitalia:    Deferred to GYN     Extremities:   No clubbing, cyanosis or edema  Pulses:   2+ and symmetric all extremities  Skin:   Skin color, texture, turgor normal, no rashes or lesions  Lymph nodes:   Cervical, supraclavicular, and axillary nodes normal  Neurologic:   CNII-XII intact, normal strength, sensation and gait; reflexes 2+ and symmetric throughout          Psych:   Normal mood, affect, hygiene and grooming.          Assessment & Plan:  Routine general medical examination at a health care facility - Plan: Lipid panel  Seasonal allergic rhinitis due to pollen  Chronic bilateral low back pain without sciatica  Fibromyalgia  Migraine with aura and without status migrainosus, not intractable  History of DVT (deep vein thrombosis) Overall she is doing quite well on the above diagnoses. She has a good idea of how to handle her migraine headaches although I did recommend that the first sign of an aura, use 800 mg of ibuprofen to see if that will abort the headache. Psychologically she does  seem to be doing much better now she realizes that stress plays a role in some symptoms that she has.

## 2016-06-05 ENCOUNTER — Encounter: Payer: Commercial Managed Care - HMO | Admitting: Family Medicine

## 2016-06-05 ENCOUNTER — Ambulatory Visit: Payer: Commercial Managed Care - HMO | Admitting: Family Medicine

## 2016-08-06 ENCOUNTER — Ambulatory Visit (HOSPITAL_COMMUNITY)
Admission: RE | Admit: 2016-08-06 | Discharge: 2016-08-06 | Disposition: A | Discharge status: Home / Self Care | Payer: BLUE CROSS/BLUE SHIELD | Source: Ambulatory Visit | Attending: Family Medicine | Admitting: Family Medicine

## 2016-08-06 ENCOUNTER — Encounter: Payer: Self-pay | Admitting: Family Medicine

## 2016-08-06 ENCOUNTER — Ambulatory Visit (INDEPENDENT_AMBULATORY_CARE_PROVIDER_SITE_OTHER): Payer: BLUE CROSS/BLUE SHIELD | Admitting: Family Medicine

## 2016-08-06 VITALS — BP 124/70 | HR 82 | Temp 98.2°F | Wt 184.6 lb

## 2016-08-06 DIAGNOSIS — M79604 Pain in right leg: Secondary | ICD-10-CM | POA: Diagnosis not present

## 2016-08-06 DIAGNOSIS — M7989 Other specified soft tissue disorders: Secondary | ICD-10-CM | POA: Insufficient documentation

## 2016-08-06 DIAGNOSIS — Z86718 Personal history of other venous thrombosis and embolism: Secondary | ICD-10-CM | POA: Insufficient documentation

## 2016-08-06 NOTE — Progress Notes (Signed)
VASCULAR LAB PRELIMINARY  PRELIMINARY  PRELIMINARY  PRELIMINARY  Right lower extremity venous duplex completed.    Preliminary report:  Right:  No evidence of DVT, superficial thrombosis, or Baker's cyst.  Peggy [name], RVS 08/06/2016, 4:52 PM

## 2016-08-06 NOTE — Progress Notes (Signed)
   Subjective:    Patient ID: Peggy [name], female    DOB: 1975/04/24, 41 y.o.   MRN: [id number]  HPI She states that 2 days ago she noted some ankle pain with no history of injury to it. Yesterday she did note the onset of calf pain. Last Saturday she went on a road trip and did spend a total of 7 hours in the car. She has no chest pain, shortness of breath, cough. She does have a previous history of DVT. The symptoms are very similar to the previous history of DVT.   Review of Systems     Objective:   Physical Exam Alert and in no distress. Homans sign is positive on the right. The calf is not warm or hot. Ankle exam shows full motion without pain.       Assessment & Plan:  Pain of right lower extremity - Plan: VAS [place] LOWER EXTREMITY VENOUS (DVT)  History of DVT (deep vein thrombosis) - Plan: VAS [place] LOWER EXTREMITY VENOUS (DVT) Sample of Xarelto given.

## 2016-09-27 DIAGNOSIS — Z01419 Encounter for gynecological examination (general) (routine) without abnormal findings: Secondary | ICD-10-CM | POA: Diagnosis not present

## 2016-09-27 DIAGNOSIS — Z1231 Encounter for screening mammogram for malignant neoplasm of breast: Secondary | ICD-10-CM | POA: Diagnosis not present

## 2016-09-27 DIAGNOSIS — Z124 Encounter for screening for malignant neoplasm of cervix: Secondary | ICD-10-CM | POA: Diagnosis not present

## 2016-09-27 DIAGNOSIS — Z6829 Body mass index (BMI) 29.0-29.9, adult: Secondary | ICD-10-CM | POA: Diagnosis not present

## 2016-10-03 ENCOUNTER — Encounter: Payer: Self-pay | Admitting: Family Medicine

## 2016-10-03 ENCOUNTER — Ambulatory Visit (INDEPENDENT_AMBULATORY_CARE_PROVIDER_SITE_OTHER): Payer: BLUE CROSS/BLUE SHIELD | Admitting: Family Medicine

## 2016-10-03 VITALS — BP 110/62 | HR 75 | Temp 98.3°F | Wt 185.2 lb

## 2016-10-03 DIAGNOSIS — L600 Ingrowing nail: Secondary | ICD-10-CM

## 2016-10-03 DIAGNOSIS — L729 Follicular cyst of the skin and subcutaneous tissue, unspecified: Secondary | ICD-10-CM | POA: Diagnosis not present

## 2016-10-03 NOTE — Progress Notes (Signed)
   Subjective:    Patient ID: Peggy [name], female    DOB: [birth year], 41 y.o.   MRN: [id number]  HPI Chief Complaint  Patient presents with  . big toe infected    big toe infected- 3rd day. had cyst underneath skin. will get flu shot at a later day   She is here with complaints of right great toe nail being red, swollen and tender for the past 2-3 days. States she cut her toenail last week because it was feeling like it was ingrown. States her symptoms were worse last week with drainage. No longer having drainage.  States she has been using peroxide and alcohol.  No history of injury or recurrent ingrown toenails.   No diabetes. Denies fever, chills.   She would also like a cyst on her upper back to be checked. This is not bothering her currently but in the past has been tender.   Reviewed allergies, medications, past medical, surgical, and social history.    Review of Systems Pertinent positives and negatives in the history of present illness.     Objective:   Physical Exam BP 110/62   Pulse 75   Temp 98.3 F (36.8 C) (Oral)   Wt 185 lb 3.2 oz (84 kg)   LMP 04/29/2014 (Exact Date)   BMI 28.58 kg/m  Right foot -great toenail with medial erythema, mild edema and tenderness. No drainage. Nail bed is healthy appearing. Right foot exam is normal otherwise.  Upper back with a 1.5 cm raised smooth, round and slightly moveable cyst. No erythema or tenderness.       Assessment & Plan:  Ingrown toenail of right foot with infection  Cyst of skin  Discussed that she does have some early infection and recommend doing soapy water or epsom salt soaks.  Samples of bacitracin given.  She will take a picture of her toe and call back if symptoms are worsening. Will treat her with on oral antibiotic if needed.  Consider return for procedure if worsening or to a podiatrist.  Advised that the cyst on her back is not infected but if this becomes bothersome she can return for treatment.

## 2016-10-03 NOTE — Patient Instructions (Addendum)
Do warm soapy water soaks or you can use epsom salt 3-4 times per day for the next few days. Apply the bacitracin to the area after drying your toe.  If you notice any worsening symptoms, you can return or see a podiatrist.

## 2017-02-26 IMAGING — RF DG CYSTOGRAM 3+V
5 series · 5 of 5 positions shown · non-contrast
Comparison: None.

CLINICAL DATA: Bladder perforation during hysterectomy
approximately 2 weeks ago, with surgical repair. Evaluate for
persistent bladder leak.

EXAM:
CYSTOGRAM
TECHNIQUE: After catheterization of the urinary bladder following sterile
technique the bladder was filled with Cysto-Hypaque 30% by drip
infusion. Serial spot images were obtained during bladder filling
and post draining.
FLUOROSCOPY TIME:  Fluoroscopy Time: 1 minutes 0 seconds ; low dose
pulsed fluoroscopy
Radiation Exposure Index (if provided by the fluoroscopic device):
9.6 mGy
Number of Acquired Spot Images: 0

[Series 1: cp_standard · 0.30mm/px · 1 of 1 slices shown (1 of 5)]
[im 1/1]
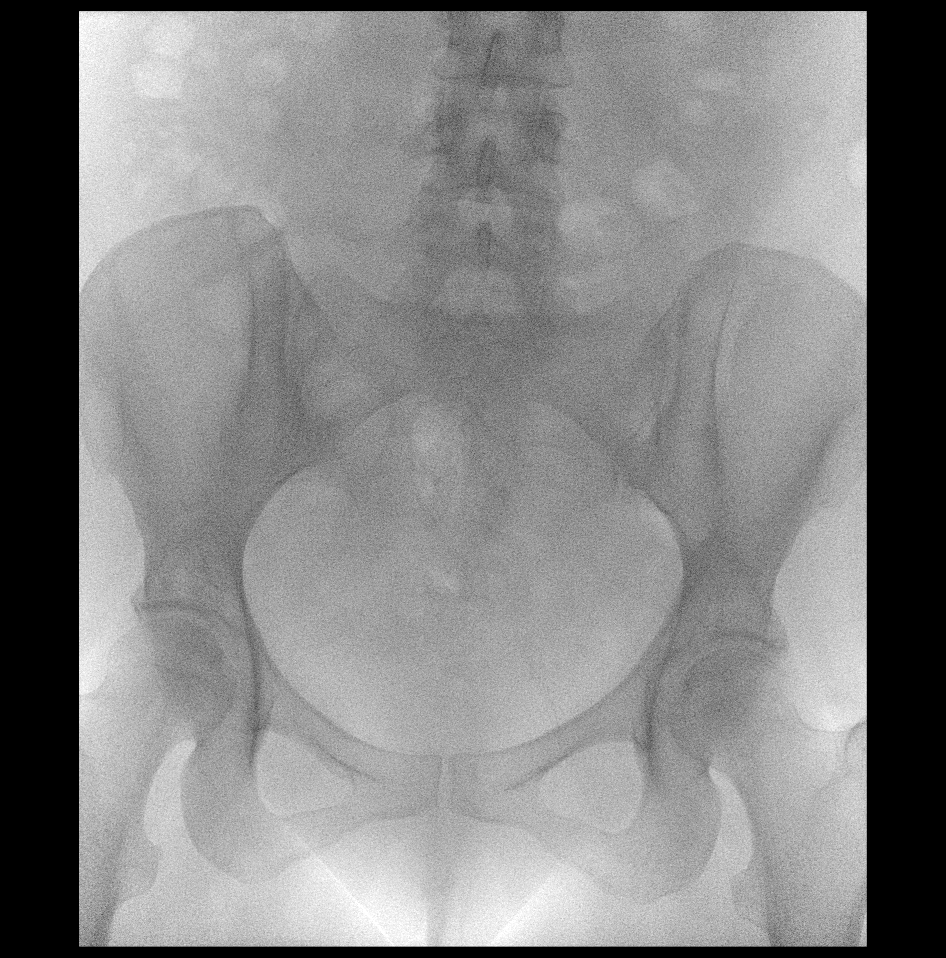

[Series 2: cp_standard · 0.32mm/px · 1 of 1 slices shown (2 of 5)]
[im 1/1]
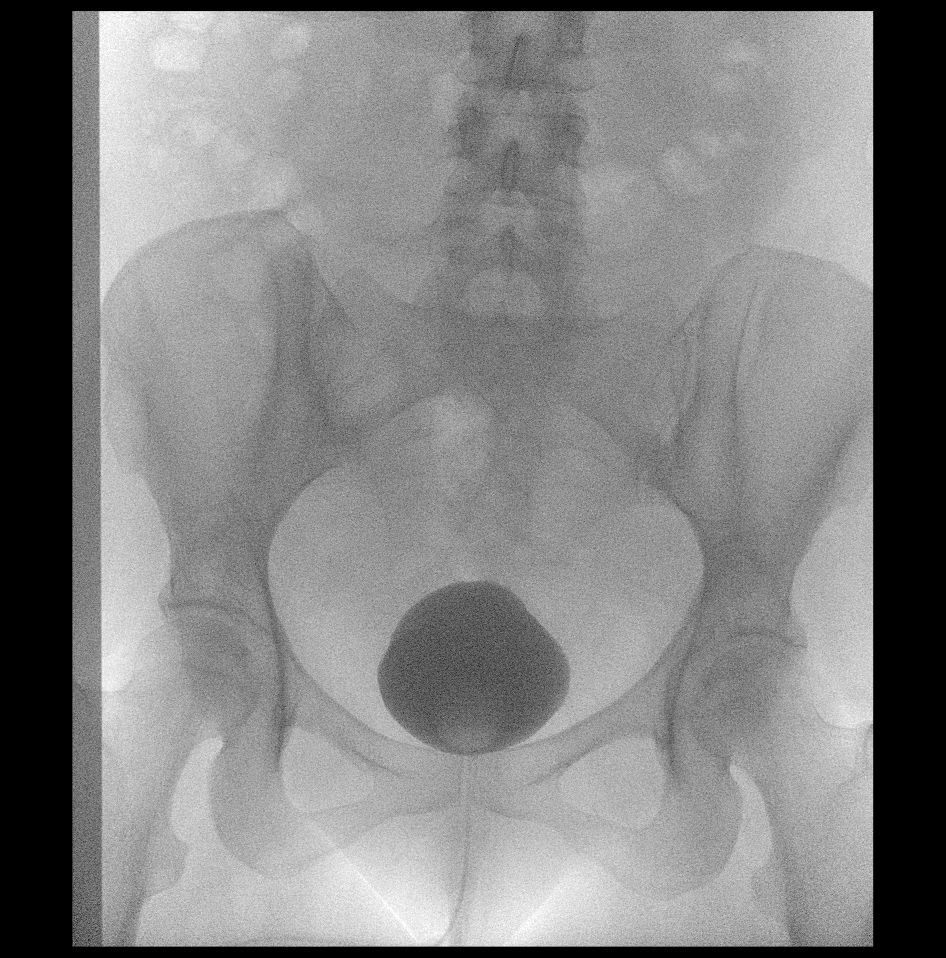

[Series 3: cp_standard · 0.32mm/px · 1 of 1 slices shown (3 of 5)]
[im 1/1]
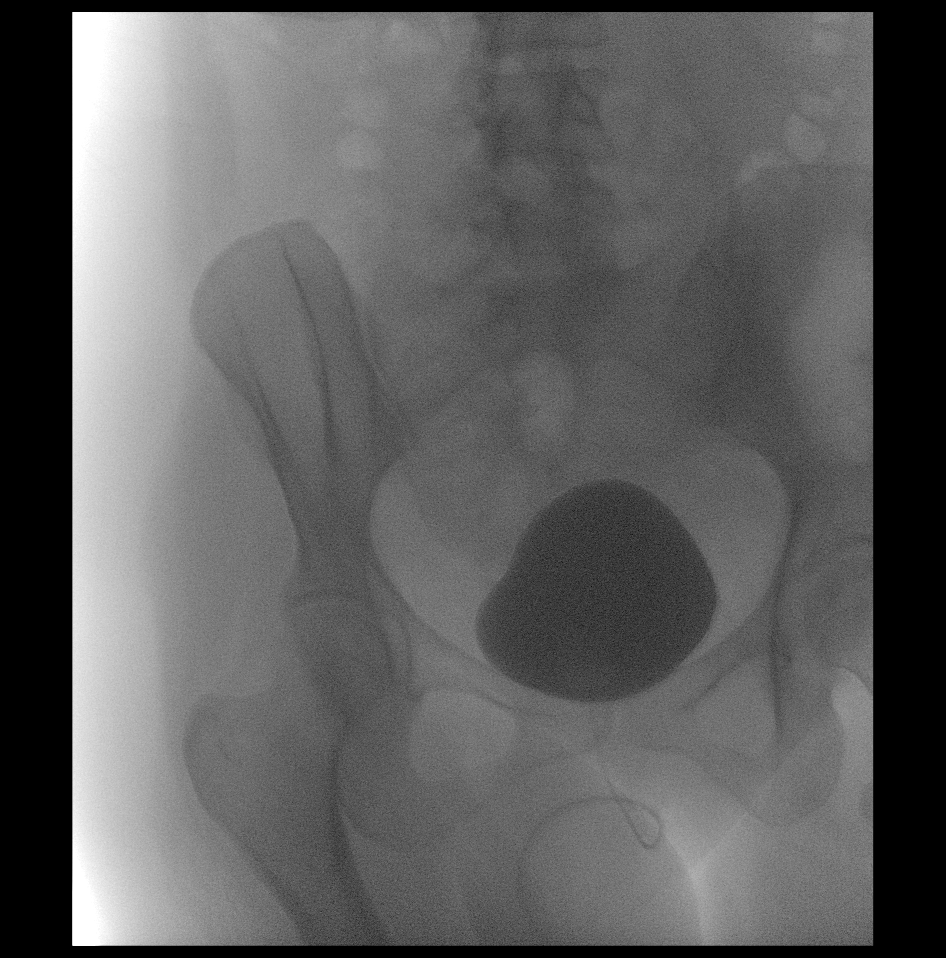

[Series 4: cp_standard · 0.32mm/px · 1 of 1 slices shown (4 of 5)]
[im 1/1]
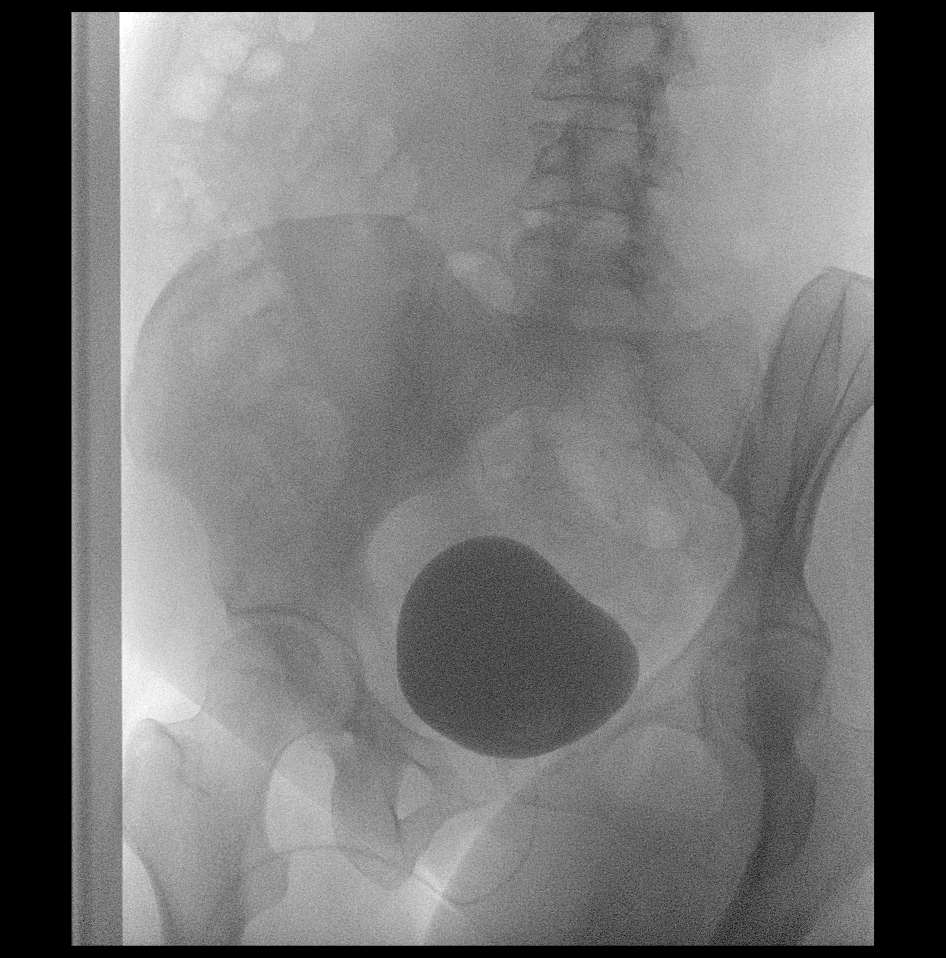

[Series 5: cp_standard · 0.33mm/px · 1 of 1 slices shown (5 of 5)]
[im 1/1]
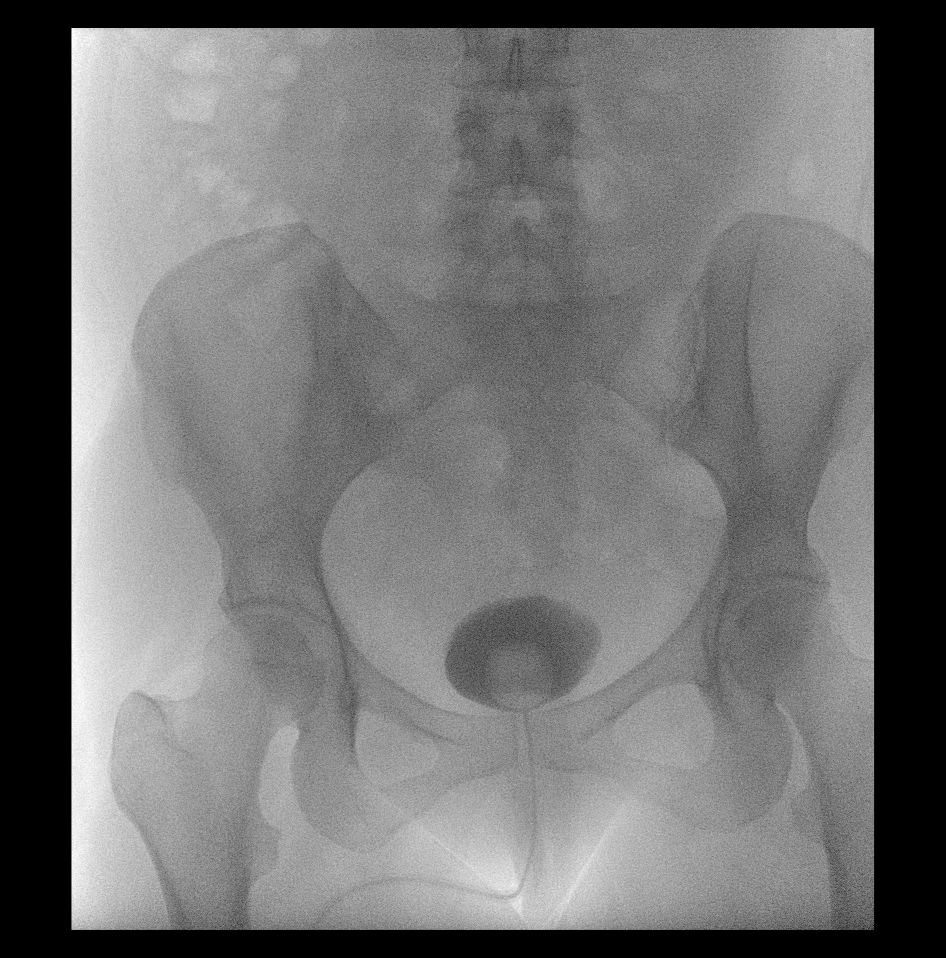

[5 of 5 positions shown; findings below may reference images not displayed]

FINDINGS: The urinary bladder is normal in contour and appearance. No evidence
of contrast leak/perforation from the urinary bladder. No evidence
of vesicoureteral reflux of contrast.
IMPRESSION: Negative cystogram.  No evidence of bladder leak/ perforation.

## 2017-08-12 ENCOUNTER — Ambulatory Visit (INDEPENDENT_AMBULATORY_CARE_PROVIDER_SITE_OTHER): Payer: BLUE CROSS/BLUE SHIELD | Admitting: Family Medicine

## 2017-08-12 ENCOUNTER — Encounter: Payer: Self-pay | Admitting: Family Medicine

## 2017-08-12 VITALS — BP 120/78 | HR 75 | Temp 97.9°F | Wt 185.0 lb

## 2017-08-12 DIAGNOSIS — L089 Local infection of the skin and subcutaneous tissue, unspecified: Secondary | ICD-10-CM | POA: Diagnosis not present

## 2017-08-12 DIAGNOSIS — L723 Sebaceous cyst: Secondary | ICD-10-CM | POA: Diagnosis not present

## 2017-08-12 MED ORDER — LIDOCAINE-EPINEPHRINE (PF) 1 %-1:200000 IJ SOLN
10.0000 mL | Freq: Once | INTRAMUSCULAR | Status: AC
  Administered 2017-08-12: 10 mL via INTRADERMAL

## 2017-08-12 NOTE — Progress Notes (Signed)
   Subjective:    Patient ID: Peggy [name], female    DOB: 06/19/1975, 42 y.o.   MRN: [id number]  HPI She complains of a lump that is become uncomfortable in her upper mid back area.  It interferes with her wearing her bra on other kinds of materials.   Review of Systems     Objective:   Physical Exam 1-1/2 cm round slightly painful area on her mid upper back area.  No erythema noted.      Assessment & Plan:  Infected sebaceous cyst The lesion was injected with Xylocaine and epinephrine.  A 2 cm incision was made.  The wound was opened.  No actual infection was present.  The cyst sac was removed with some difficulty as it was adherent to the underlying tissues.  Wound was explored and no other tissue materials was noted.  It was packed with iodoform.  She is to leave the packing in place for 2 days then remove it and irrigate the area several times per day.  Explained that this might possibly recur in the future

## 2017-08-12 NOTE — Addendum Note (Signed)
Addended by: Elyse Jarvis on: 08/12/2017 10:18 AM   Modules accepted: Orders

## 2018-03-01 ENCOUNTER — Emergency Department (HOSPITAL_COMMUNITY): Payer: BLUE CROSS/BLUE SHIELD

## 2018-03-01 ENCOUNTER — Emergency Department (HOSPITAL_COMMUNITY)
Admission: EM | Admit: 2018-03-01 | Discharge: 2018-03-01 | Disposition: A | Discharge status: Home / Self Care | Payer: BLUE CROSS/BLUE SHIELD | Attending: Emergency Medicine | Admitting: Emergency Medicine

## 2018-03-01 ENCOUNTER — Encounter (HOSPITAL_COMMUNITY): Payer: Self-pay | Admitting: Nurse Practitioner

## 2018-03-01 DIAGNOSIS — R251 Tremor, unspecified: Secondary | ICD-10-CM | POA: Diagnosis not present

## 2018-03-01 DIAGNOSIS — R519 Headache, unspecified: Secondary | ICD-10-CM

## 2018-03-01 DIAGNOSIS — H5712 Ocular pain, left eye: Secondary | ICD-10-CM | POA: Diagnosis not present

## 2018-03-01 DIAGNOSIS — Z862 Personal history of diseases of the blood and blood-forming organs and certain disorders involving the immune mechanism: Secondary | ICD-10-CM | POA: Diagnosis not present

## 2018-03-01 DIAGNOSIS — H538 Other visual disturbances: Secondary | ICD-10-CM | POA: Diagnosis not present

## 2018-03-01 DIAGNOSIS — Z79899 Other long term (current) drug therapy: Secondary | ICD-10-CM | POA: Insufficient documentation

## 2018-03-01 DIAGNOSIS — R51 Headache: Secondary | ICD-10-CM | POA: Insufficient documentation

## 2018-03-01 DIAGNOSIS — R9431 Abnormal electrocardiogram [ECG] [EKG]: Secondary | ICD-10-CM | POA: Diagnosis not present

## 2018-03-01 LAB — CBC
HCT: 51.5 % — ABNORMAL HIGH (ref 36.0–46.0)
Hemoglobin: 16.5 g/dL — ABNORMAL HIGH (ref 12.0–15.0)
MCH: 28.6 pg (ref 26.0–34.0)
MCHC: 32 g/dL (ref 30.0–36.0)
MCV: 89.3 fL (ref 80.0–100.0)
Platelets: 162 10*3/uL (ref 150–400)
RBC: 5.77 MIL/uL — ABNORMAL HIGH (ref 3.87–5.11)
RDW: 13 % (ref 11.5–15.5)
WBC: 9.2 10*3/uL (ref 4.0–10.5)
nRBC: 0.4 % — ABNORMAL HIGH (ref 0.0–0.2)

## 2018-03-01 LAB — DIFFERENTIAL
Abs Immature Granulocytes: 0.02 10*3/uL (ref 0.00–0.07)
Basophils Absolute: 0 10*3/uL (ref 0.0–0.1)
Basophils Relative: 0 %
Eosinophils Absolute: 0.2 10*3/uL (ref 0.0–0.5)
Eosinophils Relative: 2 %
Immature Granulocytes: 0 %
Lymphocytes Relative: 30 %
Lymphs Abs: 2.8 10*3/uL (ref 0.7–4.0)
Monocytes Absolute: 0.6 10*3/uL (ref 0.1–1.0)
Monocytes Relative: 7 %
Neutro Abs: 5.6 10*3/uL (ref 1.7–7.7)
Neutrophils Relative %: 61 %

## 2018-03-01 LAB — COMPREHENSIVE METABOLIC PANEL
ALT: 28 U/L (ref 0–44)
AST: 28 U/L (ref 15–41)
Albumin: 4.6 g/dL (ref 3.5–5.0)
Alkaline Phosphatase: 46 U/L (ref 38–126)
Anion gap: 10 (ref 5–15)
BUN: 13 mg/dL (ref 6–20)
CALCIUM: 9.5 mg/dL (ref 8.9–10.3)
CO2: 25 mmol/L (ref 22–32)
Chloride: 103 mmol/L (ref 98–111)
Creatinine, Ser: 0.65 mg/dL (ref 0.44–1.00)
GFR calc non Af Amer: >60 mL/min (ref >60)
Glucose, Bld: 85 mg/dL (ref 70–99)
Potassium: 4.1 mmol/L (ref 3.5–5.1)
Sodium: 138 mmol/L (ref 135–145)
TOTAL PROTEIN: 8.3 g/dL — AB (ref 6.5–8.1)
Total Bilirubin: 0.6 mg/dL (ref 0.3–1.2)

## 2018-03-01 LAB — URINALYSIS, ROUTINE W REFLEX MICROSCOPIC
Bilirubin Urine: NEGATIVE
Glucose, UA: NEGATIVE mg/dL
Hgb urine dipstick: NEGATIVE
Ketones, ur: NEGATIVE mg/dL
Leukocytes,Ua: NEGATIVE
Nitrite: NEGATIVE
PH: 5 (ref 5.0–8.0)
Protein, ur: NEGATIVE mg/dL
SPECIFIC GRAVITY, URINE: 1.011 (ref 1.005–1.030)

## 2018-03-01 LAB — PROTIME-INR
INR: 0.96
INR: 1.04
Prothrombin Time: 12.7 seconds (ref 11.4–15.2)
Prothrombin Time: 13.5 seconds (ref 11.4–15.2)

## 2018-03-01 LAB — I-STAT BETA HCG BLOOD, ED (MC, WL, AP ONLY): I-stat hCG, quantitative: <5 m[IU]/mL (ref <5)

## 2018-03-01 LAB — RAPID URINE DRUG SCREEN, HOSP PERFORMED
Amphetamines: NOT DETECTED
Barbiturates: NOT DETECTED
Benzodiazepines: NOT DETECTED
Cocaine: NOT DETECTED
Opiates: NOT DETECTED
Tetrahydrocannabinol: NOT DETECTED

## 2018-03-01 LAB — ETHANOL: Alcohol, Ethyl (B): <10 mg/dL (ref <10)

## 2018-03-01 LAB — APTT
aPTT: 20 seconds — ABNORMAL LOW (ref 24–36)
aPTT: 31 seconds (ref 24–36)

## 2018-03-01 MED ORDER — DIPHENHYDRAMINE HCL 50 MG/ML IJ SOLN
25.0000 mg | Freq: Once | INTRAMUSCULAR | Status: AC
  Administered 2018-03-01: 25 mg via INTRAVENOUS
  Filled 2018-03-01: qty 1

## 2018-03-01 MED ORDER — DEXAMETHASONE SODIUM PHOSPHATE 10 MG/ML IJ SOLN
10.0000 mg | Freq: Once | INTRAMUSCULAR | Status: AC
  Administered 2018-03-01: 10 mg via INTRAVENOUS
  Filled 2018-03-01: qty 1

## 2018-03-01 MED ORDER — GADOBUTROL 1 MMOL/ML IV SOLN
7.5000 mL | Freq: Once | INTRAVENOUS | Status: AC | PRN
  Administered 2018-03-01: 7.5 mL via INTRAVENOUS

## 2018-03-01 MED ORDER — METOCLOPRAMIDE HCL 5 MG/ML IJ SOLN
10.0000 mg | Freq: Once | INTRAMUSCULAR | Status: AC
  Administered 2018-03-01: 10 mg via INTRAVENOUS
  Filled 2018-03-01: qty 2

## 2018-03-01 MED ORDER — SODIUM CHLORIDE 0.9 % IV BOLUS
1000.0000 mL | Freq: Once | INTRAVENOUS | Status: AC
  Administered 2018-03-01: 1000 mL via INTRAVENOUS

## 2018-03-01 MED ORDER — TETRACAINE HCL 0.5 % OP SOLN
2.0000 [drp] | Freq: Once | OPHTHALMIC | Status: DC
  Filled 2018-03-01: qty 4

## 2018-03-01 NOTE — Discharge Instructions (Addendum)
You were seen in the emergency department for blurred vision headache some tremor.  You had a CAT scan lab work and an MRI with and without contrast that did not show an obvious explanation for your symptoms.  It will be important for you to follow-up with ophthalmology so they can better evaluate your eye and also with your neurologist.  Please return if any worsening symptoms.

## 2018-03-01 NOTE — ED Notes (Signed)
Patient ambulated to RR without assistance. Urine sample collected by tech.

## 2018-03-01 NOTE — ED Notes (Signed)
Patient transported to MRI via stretcher.

## 2018-03-01 NOTE — ED Provider Notes (Signed)
[facility] DEPT Provider Note   CSN: [id number] Arrival date & time: 03/01/18  1603     History   Chief Complaint Chief Complaint  Patient presents with  . Blurred Vision    HPI Peggy [name] is a 43 y.o. female.  She is complaining of acute onset of pain and blurred vision in her left eye and less so in her right eye that started 3 PM today.  Is been associated with some whole body trembling.  She says her eye feels like a migraine.  She said she has had this before and she follows with a neurologist at [facility].  They have done tests and have not figured out what her symptoms are caused by.  She has been diagnosed with optic neuritis in the past.  They found a few white matter lesions but have not given her the diagnosis of MS.  She said the last time this happened they gave her a migraine cocktail and it improved.  She said she has had some sinus infection symptoms for the last week.  The history is provided by the patient.  Cerebrovascular Accident  This is a recurrent problem. The current episode started 1 to 2 hours ago. The problem occurs constantly. The problem has not changed since onset.Associated symptoms include headaches. Pertinent negatives include no chest pain, no abdominal pain and no shortness of breath. Exacerbated by: bright lights. Nothing relieves the symptoms. She has tried nothing for the symptoms. The treatment provided no relief.    Past Medical History:  Diagnosis Date  . Anemia   . Arthritis    foot  . DVT (deep venous thrombosis) ([place])   . Fibroid uterus   . Fibromyalgia   . GERD (gastroesophageal reflux disease)   . Headache    Migraines  . Infertility management     Patient Active Problem List   Diagnosis Date Noted  . Seasonal allergic rhinitis due to pollen 06/04/2016  . History of DVT (deep vein thrombosis) 06/04/2016  . Migraine with aura and without status migrainosus, not intractable 06/04/2016  . Rigidity of  first metatarsophalangeal joint of right foot 09/06/2014  . Bilateral low back pain without sciatica 08/09/2014  . Fibromyalgia 07/06/2010    Past Surgical History:  Procedure Laterality Date  . ABDOMINAL HYSTERECTOMY  2017  . CARDIAC CATHETERIZATION  approx 8 years ago   for chest pain,   . CESAREAN SECTION    . CESAREAN SECTION N/A 02/29/2012   Procedure: Repeat CESAREAN SECTION of baby boy at [place] 8/8;  Surgeon: Daria Pastures, MD;  Location: [facility];  Service: Obstetrics;  Laterality: N/A;  . CYSTOSCOPY  10/11/2015   Procedure: CYSTOSCOPY;  Surgeon: Bobbye Charleston, MD;  Location: [facility];  Service: Gynecology;;  . FOOT SURGERY     right foot  . LAPAROSCOPIC BILATERAL SALPINGECTOMY  10/11/2015   Procedure: LAPAROSCOPIC BILATERAL SALPINGECTOMY;  Surgeon: Bobbye Charleston, MD;  Location: [facility];  Service: Gynecology;;  . ROBOTIC ASSISTED TOTAL HYSTERECTOMY N/A 10/11/2015   Procedure: ROBOTIC ASSISTED TOTAL HYSTERECTOMY, Laparoscopic Repair Cystotomy;  Surgeon: Bobbye Charleston, MD;  Location: [facility];  Service: Gynecology;  Laterality: N/A;  . TONSILLECTOMY    . TONSILLECTOMY       OB History    Gravida  2   Para  2   Term  1   Preterm  1   AB      Living  3     SAB  TAB      Ectopic      Multiple      Live Births  2            Home Medications    Prior to Admission medications   Medication Sig Start Date End Date Taking? Authorizing Provider  Pseudoephedrine-Ibuprofen (ADVIL COLD/SINUS) 30-200 MG TABS Take 1 tablet by mouth daily as needed (cold symptoms).   Yes [provider]  rizatriptan (MAXALT) 10 MG tablet Take 10 mg by mouth every 2 (two) hours as needed for migraine. May repeat in 2 hours if needed   Yes [provider]  topiramate (TOPAMAX) 25 MG capsule Take 25 mg by mouth 2 (two) times daily.    [provider]    Family History Family History  Problem Relation Age of Onset  . Diabetes Maternal  Grandmother   . Heart disease Maternal Grandmother   . Heart disease Maternal Grandfather     Social History Social History   Tobacco Use  . Smoking status: Never Smoker  . Smokeless tobacco: Never Used  Substance Use Topics  . Alcohol use: Yes    Comment: 1-2 drinks per month.  . Drug use: No     Allergies   Patient has no known allergies.   Review of Systems Review of Systems  Constitutional: Negative for fever.  HENT: Positive for sinus pressure. Negative for sore throat.   Eyes: Positive for pain and visual disturbance.  Respiratory: Negative for shortness of breath.   Cardiovascular: Negative for chest pain.  Gastrointestinal: Negative for abdominal pain.  Genitourinary: Negative for dysuria.  Musculoskeletal: Negative for neck pain.  Skin: Negative for rash.  Neurological: Positive for tremors and headaches.     Physical Exam Updated Vital Signs BP (!) 144/97   Pulse 85   Temp 98.9 F (37.2 C) (Oral)   Resp 18   LMP 04/29/2014 (Exact Date)   SpO2 98%   Physical Exam Vitals signs and nursing note reviewed.  Constitutional:      General: She is not in acute distress.    Appearance: She is well-developed.  HENT:     Head: Normocephalic and atraumatic.     Right Ear: Tympanic membrane normal.     Left Ear: Tympanic membrane normal.     Nose: Nose normal.     Mouth/Throat:     Mouth: Mucous membranes are moist.     Pharynx: Oropharynx is clear.  Eyes:     Extraocular Movements: Extraocular movements intact.     Conjunctiva/sclera: Conjunctivae normal.     Pupils: Pupils are equal, round, and reactive to light.     Funduscopic exam:    Right eye: No hemorrhage, exudate or papilledema.        Left eye: No hemorrhage, exudate or papilledema.     Slit lamp exam:    Right eye: Anterior chamber quiet.     Left eye: Anterior chamber quiet.  Neck:     Musculoskeletal: Neck supple.  Cardiovascular:     Rate and Rhythm: Normal rate and regular rhythm.       Heart sounds: No murmur.  Pulmonary:     Effort: Pulmonary effort is normal. No respiratory distress.     Breath sounds: Normal breath sounds.  Abdominal:     Palpations: Abdomen is soft.     Tenderness: There is no abdominal tenderness.  Skin:    General: Skin is warm and dry.  Neurological:     Mental  Status: She is alert and oriented to person, place, and time.     GCS: GCS eye subscore is 4. GCS verbal subscore is 5. GCS motor subscore is 6.     Cranial Nerves: Cranial nerve deficit (blurry vision - otherwise nl) present.     Motor: Weakness (LLE) present.      ED Treatments / Results  Labs (all labs ordered are listed, but only abnormal results are displayed) Labs Reviewed  APTT - Abnormal; Notable for the following components:      Result Value   aPTT 20 (*)    All other components within normal limits  CBC - Abnormal; Notable for the following components:   RBC 5.77 (*)    Hemoglobin 16.5 (*)    HCT 51.5 (*)    nRBC 0.4 (*)    All other components within normal limits  COMPREHENSIVE METABOLIC PANEL - Abnormal; Notable for the following components:   Total Protein 8.3 (*)    All other components within normal limits  ETHANOL  PROTIME-INR  DIFFERENTIAL  RAPID URINE DRUG SCREEN, HOSP PERFORMED  URINALYSIS, ROUTINE W REFLEX MICROSCOPIC  PROTIME-INR  APTT  I-STAT BETA HCG BLOOD, ED ([facility], [facility], [facility] ONLY)    EKG EKG Interpretation  Date/Time:  Sunday March 01 2018 17:22:29 EST Ventricular Rate:  78 PR Interval:    QRS Duration: 87 QT Interval:  395 QTC Calculation: 450 R Axis:   8 Text Interpretation:  Sinus rhythm Probable left atrial enlargement Low voltage, precordial leads similar to prior 3/18 Confirmed by Aletta Edouard [phone]) on 03/01/2018 5:28:29 PM   Radiology Ct Head Wo Contrast  Result Date: 03/01/2018 CLINICAL DATA:  LEFT eye pain, onset 2 hours ago. EXAM: CT HEAD WITHOUT CONTRAST TECHNIQUE: Contiguous axial images were obtained from the  base of the skull through the vertex without intravenous contrast. COMPARISON:  Head CT dated 03/20/2016. FINDINGS: Brain: Ventricles are normal in size and configuration. All areas of the brain demonstrate appropriate gray-white matter differentiation. There is no mass, hemorrhage, edema or other evidence of acute parenchymal abnormality. No extra-axial hemorrhage. Vascular: No hyperdense vessel or unexpected calcification. Skull: Normal. Negative for fracture or focal lesion. Sinuses/Orbits: No acute finding. Other: None. IMPRESSION: Normal head CT. Electronically Signed   By: Franki Cabot M.D.   On: 03/01/2018 17:43   Mr Jeri Cos And Wo Contrast  Result Date: 03/01/2018 CLINICAL DATA:  Shakiness, blurred vision, and left eye pain. EXAM: MRI HEAD WITHOUT AND WITH CONTRAST TECHNIQUE: Multiplanar, multiecho pulse sequences of the brain and surrounding structures were obtained without and with intravenous contrast. CONTRAST:  7.5 mL Gadavist COMPARISON:  Head CT 03/01/2018 and MRI 03/20/2016 FINDINGS: Brain: There is no evidence of acute infarct, intracranial hemorrhage, mass, midline shift, or extra-axial fluid collection. The ventricles and sulci are normal. A few punctate foci of T2 hyperintensity in the cerebral white matter bilaterally are unchanged from the prior MRI, nonspecific, and not considered abnormal for age. No abnormal enhancement is identified. Vascular: Major intracranial vascular flow voids are preserved with the left vertebral artery being diminutive and terminating in PICA. Skull and upper cervical spine: Unremarkable bone marrow signal. Sinuses/Orbits: Unremarkable orbits. Paranasal sinuses and mastoid air cells are clear. Other: None. IMPRESSION: Negative brain MRI. Electronically Signed   By: Logan Bores M.D.   On: 03/01/2018 21:44    Procedures Procedures (including critical care time)  Medications Ordered in ED Medications  tetracaine (PONTOCAINE) 0.5 % ophthalmic solution 2  drop (0 drops Both  Eyes Hold 03/01/18 1810)  dexamethasone (DECADRON) injection 10 mg (10 mg Intravenous Given 03/01/18 1824)  metoCLOPramide (REGLAN) injection 10 mg (10 mg Intravenous Given 03/01/18 1825)  diphenhydrAMINE (BENADRYL) injection 25 mg (25 mg Intravenous Given 03/01/18 1826)  sodium chloride 0.9 % bolus 1,000 mL (0 mLs Intravenous Stopped 03/01/18 2020)  gadobutrol (GADAVIST) 1 MMOL/ML injection 7.5 mL (7.5 mLs Intravenous Contrast Given 03/01/18 2113)     Initial Impression / Assessment and Plan / ED Course  I have reviewed the triage vital signs and the nursing notes.  Pertinent labs & imaging results that were available during my care of the patient were reviewed by me and considered in my medical decision making (see chart for details).  Clinical Course as of Mar 01 2299  Nancy Fetter Mar 01, 2018  1722 Discussed with Dr. Lorraine Lax from [facility] neurology.  He said this pattern does not sound like stroke and he agrees with migraine cocktail.  He says that she still having symptoms after that he would recommend an MRI with and without.   [MB]  5465 Reevaluated patient.  She says her symptoms are about the same of the vision in the right eye is a little improved.  They were having some difficulties placing an IV and they are trying 1 by ultrasound now.  She has not received any medications yet.   [MB]  1847 Tono-Pen measurements after proparacaine.  She was 14 in the right and 16 in the left.   [MB]  2020 Patient states her symptoms about 50% better after medication.  I talked her about an MRI and she said she be more comfortable getting an MRI to make sure there is nothing new showing up.   [MB]  2158 MRI with and without is negative for any acute findings.  Will review with the patient.   [MB]    Clinical Course User Index [MB] Hayden Rasmussen, MD     Final Clinical Impressions(s) / ED Diagnoses   Final diagnoses:  Blurred vision  Generalized headache    ED Discharge Orders     None       Hayden Rasmussen, MD 03/01/18 2302

## 2018-03-01 NOTE — ED Triage Notes (Signed)
Pt reports shakiness and blurred vision, starting 90 minutes ago.. Patient and husband stated that she has been evaluated multiple times over the last 10 years for similar episodes. Pt is alert, oriented asnd appropriate.Pt has been evaluated at [facility] to r/o MS.

## 2018-03-02 DIAGNOSIS — H169 Unspecified keratitis: Secondary | ICD-10-CM | POA: Diagnosis not present

## 2018-03-18 DIAGNOSIS — H169 Unspecified keratitis: Secondary | ICD-10-CM | POA: Diagnosis not present

## 2018-03-19 DIAGNOSIS — G43109 Migraine with aura, not intractable, without status migrainosus: Secondary | ICD-10-CM | POA: Diagnosis not present

## 2018-03-19 DIAGNOSIS — G4700 Insomnia, unspecified: Secondary | ICD-10-CM | POA: Diagnosis not present

## 2018-03-19 DIAGNOSIS — G43009 Migraine without aura, not intractable, without status migrainosus: Secondary | ICD-10-CM | POA: Diagnosis not present

## 2018-03-19 DIAGNOSIS — G43709 Chronic migraine without aura, not intractable, without status migrainosus: Secondary | ICD-10-CM | POA: Diagnosis not present

## 2018-06-04 DIAGNOSIS — G43109 Migraine with aura, not intractable, without status migrainosus: Secondary | ICD-10-CM | POA: Diagnosis not present

## 2018-06-04 DIAGNOSIS — R251 Tremor, unspecified: Secondary | ICD-10-CM | POA: Diagnosis not present

## 2018-06-12 DIAGNOSIS — Z6831 Body mass index (BMI) 31.0-31.9, adult: Secondary | ICD-10-CM | POA: Diagnosis not present

## 2018-06-12 DIAGNOSIS — G4710 Hypersomnia, unspecified: Secondary | ICD-10-CM | POA: Diagnosis not present

## 2018-06-23 DIAGNOSIS — G4733 Obstructive sleep apnea (adult) (pediatric): Secondary | ICD-10-CM | POA: Diagnosis not present

## 2018-06-23 DIAGNOSIS — G4710 Hypersomnia, unspecified: Secondary | ICD-10-CM | POA: Diagnosis not present

## 2018-07-01 DIAGNOSIS — G4733 Obstructive sleep apnea (adult) (pediatric): Secondary | ICD-10-CM | POA: Diagnosis not present

## 2018-07-20 DIAGNOSIS — G43709 Chronic migraine without aura, not intractable, without status migrainosus: Secondary | ICD-10-CM | POA: Diagnosis not present

## 2018-07-20 DIAGNOSIS — E669 Obesity, unspecified: Secondary | ICD-10-CM | POA: Diagnosis not present

## 2018-07-20 DIAGNOSIS — G4733 Obstructive sleep apnea (adult) (pediatric): Secondary | ICD-10-CM | POA: Diagnosis not present

## 2018-07-20 DIAGNOSIS — G43109 Migraine with aura, not intractable, without status migrainosus: Secondary | ICD-10-CM | POA: Diagnosis not present

## 2018-07-21 DIAGNOSIS — M25541 Pain in joints of right hand: Secondary | ICD-10-CM | POA: Diagnosis not present

## 2018-07-21 DIAGNOSIS — M7989 Other specified soft tissue disorders: Secondary | ICD-10-CM | POA: Diagnosis not present

## 2018-07-21 DIAGNOSIS — R202 Paresthesia of skin: Secondary | ICD-10-CM | POA: Diagnosis not present

## 2018-07-21 DIAGNOSIS — M25542 Pain in joints of left hand: Secondary | ICD-10-CM | POA: Diagnosis not present

## 2018-07-21 DIAGNOSIS — R600 Localized edema: Secondary | ICD-10-CM | POA: Diagnosis not present

## 2018-07-31 DIAGNOSIS — G4733 Obstructive sleep apnea (adult) (pediatric): Secondary | ICD-10-CM | POA: Diagnosis not present

## 2018-08-06 DIAGNOSIS — H5340 Unspecified visual field defects: Secondary | ICD-10-CM | POA: Diagnosis not present

## 2018-08-06 DIAGNOSIS — H5712 Ocular pain, left eye: Secondary | ICD-10-CM | POA: Diagnosis not present

## 2018-08-12 DIAGNOSIS — Z9989 Dependence on other enabling machines and devices: Secondary | ICD-10-CM | POA: Diagnosis not present

## 2018-08-12 DIAGNOSIS — G4733 Obstructive sleep apnea (adult) (pediatric): Secondary | ICD-10-CM | POA: Diagnosis not present

## 2018-08-22 DIAGNOSIS — G939 Disorder of brain, unspecified: Secondary | ICD-10-CM | POA: Diagnosis not present

## 2018-08-22 DIAGNOSIS — H5712 Ocular pain, left eye: Secondary | ICD-10-CM | POA: Diagnosis not present

## 2018-08-26 DIAGNOSIS — R413 Other amnesia: Secondary | ICD-10-CM | POA: Diagnosis not present

## 2018-08-26 DIAGNOSIS — R200 Anesthesia of skin: Secondary | ICD-10-CM | POA: Diagnosis not present

## 2018-08-26 DIAGNOSIS — R202 Paresthesia of skin: Secondary | ICD-10-CM | POA: Diagnosis not present

## 2018-08-26 DIAGNOSIS — R531 Weakness: Secondary | ICD-10-CM | POA: Diagnosis not present

## 2018-08-31 DIAGNOSIS — G4733 Obstructive sleep apnea (adult) (pediatric): Secondary | ICD-10-CM | POA: Diagnosis not present

## 2018-09-05 DIAGNOSIS — R200 Anesthesia of skin: Secondary | ICD-10-CM | POA: Diagnosis not present

## 2018-09-05 DIAGNOSIS — R202 Paresthesia of skin: Secondary | ICD-10-CM | POA: Diagnosis not present

## 2018-09-05 DIAGNOSIS — R531 Weakness: Secondary | ICD-10-CM | POA: Diagnosis not present

## 2018-09-24 DIAGNOSIS — R413 Other amnesia: Secondary | ICD-10-CM | POA: Diagnosis not present

## 2018-09-28 DIAGNOSIS — Z872 Personal history of diseases of the skin and subcutaneous tissue: Secondary | ICD-10-CM | POA: Diagnosis not present

## 2018-09-28 DIAGNOSIS — B078 Other viral warts: Secondary | ICD-10-CM | POA: Diagnosis not present

## 2018-09-28 DIAGNOSIS — L821 Other seborrheic keratosis: Secondary | ICD-10-CM | POA: Diagnosis not present

## 2018-09-28 DIAGNOSIS — D485 Neoplasm of uncertain behavior of skin: Secondary | ICD-10-CM | POA: Diagnosis not present

## 2018-09-28 DIAGNOSIS — D1801 Hemangioma of skin and subcutaneous tissue: Secondary | ICD-10-CM | POA: Diagnosis not present

## 2018-09-28 DIAGNOSIS — L989 Disorder of the skin and subcutaneous tissue, unspecified: Secondary | ICD-10-CM | POA: Diagnosis not present

## 2018-10-01 DIAGNOSIS — G4733 Obstructive sleep apnea (adult) (pediatric): Secondary | ICD-10-CM | POA: Diagnosis not present

## 2018-10-08 DIAGNOSIS — M2550 Pain in unspecified joint: Secondary | ICD-10-CM | POA: Diagnosis not present

## 2018-10-23 DIAGNOSIS — M7731 Calcaneal spur, right foot: Secondary | ICD-10-CM | POA: Diagnosis not present

## 2018-10-23 DIAGNOSIS — M25571 Pain in right ankle and joints of right foot: Secondary | ICD-10-CM | POA: Diagnosis not present

## 2018-10-23 DIAGNOSIS — Z86718 Personal history of other venous thrombosis and embolism: Secondary | ICD-10-CM | POA: Diagnosis not present

## 2018-10-23 DIAGNOSIS — I8001 Phlebitis and thrombophlebitis of superficial vessels of right lower extremity: Secondary | ICD-10-CM | POA: Diagnosis not present

## 2018-10-23 DIAGNOSIS — M069 Rheumatoid arthritis, unspecified: Secondary | ICD-10-CM | POA: Diagnosis not present

## 2018-10-28 DIAGNOSIS — R413 Other amnesia: Secondary | ICD-10-CM | POA: Diagnosis not present

## 2018-10-28 DIAGNOSIS — R200 Anesthesia of skin: Secondary | ICD-10-CM | POA: Diagnosis not present

## 2018-10-28 DIAGNOSIS — R202 Paresthesia of skin: Secondary | ICD-10-CM | POA: Diagnosis not present

## 2018-10-28 DIAGNOSIS — R531 Weakness: Secondary | ICD-10-CM | POA: Diagnosis not present

## 2018-11-13 DIAGNOSIS — G43709 Chronic migraine without aura, not intractable, without status migrainosus: Secondary | ICD-10-CM | POA: Diagnosis not present

## 2018-11-13 DIAGNOSIS — M069 Rheumatoid arthritis, unspecified: Secondary | ICD-10-CM | POA: Diagnosis not present

## 2018-11-13 DIAGNOSIS — S93491S Sprain of other ligament of right ankle, sequela: Secondary | ICD-10-CM | POA: Diagnosis not present

## 2018-11-13 DIAGNOSIS — M797 Fibromyalgia: Secondary | ICD-10-CM | POA: Diagnosis not present

## 2018-11-18 DIAGNOSIS — S93401D Sprain of unspecified ligament of right ankle, subsequent encounter: Secondary | ICD-10-CM | POA: Diagnosis not present

## 2018-11-18 DIAGNOSIS — S93401A Sprain of unspecified ligament of right ankle, initial encounter: Secondary | ICD-10-CM | POA: Diagnosis not present

## 2018-11-18 DIAGNOSIS — M722 Plantar fascial fibromatosis: Secondary | ICD-10-CM | POA: Diagnosis not present

## 2018-11-19 DIAGNOSIS — G43109 Migraine with aura, not intractable, without status migrainosus: Secondary | ICD-10-CM | POA: Diagnosis not present

## 2018-11-19 DIAGNOSIS — G43709 Chronic migraine without aura, not intractable, without status migrainosus: Secondary | ICD-10-CM | POA: Diagnosis not present

## 2018-11-19 DIAGNOSIS — E669 Obesity, unspecified: Secondary | ICD-10-CM | POA: Diagnosis not present

## 2018-11-19 DIAGNOSIS — G4733 Obstructive sleep apnea (adult) (pediatric): Secondary | ICD-10-CM | POA: Diagnosis not present

## 2018-12-08 DIAGNOSIS — R262 Difficulty in walking, not elsewhere classified: Secondary | ICD-10-CM | POA: Diagnosis not present

## 2018-12-08 DIAGNOSIS — M25571 Pain in right ankle and joints of right foot: Secondary | ICD-10-CM | POA: Diagnosis not present

## 2018-12-14 DIAGNOSIS — R262 Difficulty in walking, not elsewhere classified: Secondary | ICD-10-CM | POA: Diagnosis not present

## 2018-12-14 DIAGNOSIS — M25571 Pain in right ankle and joints of right foot: Secondary | ICD-10-CM | POA: Diagnosis not present

## 2018-12-15 DIAGNOSIS — R262 Difficulty in walking, not elsewhere classified: Secondary | ICD-10-CM | POA: Diagnosis not present

## 2018-12-15 DIAGNOSIS — M25571 Pain in right ankle and joints of right foot: Secondary | ICD-10-CM | POA: Diagnosis not present

## 2018-12-17 DIAGNOSIS — M7671 Peroneal tendinitis, right leg: Secondary | ICD-10-CM | POA: Diagnosis not present

## 2018-12-17 DIAGNOSIS — M0600 Rheumatoid arthritis without rheumatoid factor, unspecified site: Secondary | ICD-10-CM | POA: Diagnosis not present

## 2018-12-17 DIAGNOSIS — M25373 Other instability, unspecified ankle: Secondary | ICD-10-CM | POA: Diagnosis not present

## 2018-12-17 DIAGNOSIS — M797 Fibromyalgia: Secondary | ICD-10-CM | POA: Diagnosis not present

## 2018-12-22 DIAGNOSIS — R262 Difficulty in walking, not elsewhere classified: Secondary | ICD-10-CM | POA: Diagnosis not present

## 2018-12-22 DIAGNOSIS — M7671 Peroneal tendinitis, right leg: Secondary | ICD-10-CM | POA: Diagnosis not present

## 2018-12-22 DIAGNOSIS — M25774 Osteophyte, right foot: Secondary | ICD-10-CM | POA: Diagnosis not present

## 2018-12-22 DIAGNOSIS — M25571 Pain in right ankle and joints of right foot: Secondary | ICD-10-CM | POA: Diagnosis not present

## 2018-12-22 DIAGNOSIS — M79674 Pain in right toe(s): Secondary | ICD-10-CM | POA: Diagnosis not present

## 2018-12-22 DIAGNOSIS — M19071 Primary osteoarthritis, right ankle and foot: Secondary | ICD-10-CM | POA: Diagnosis not present

## 2018-12-22 DIAGNOSIS — M79671 Pain in right foot: Secondary | ICD-10-CM | POA: Diagnosis not present

## 2018-12-24 DIAGNOSIS — R262 Difficulty in walking, not elsewhere classified: Secondary | ICD-10-CM | POA: Diagnosis not present

## 2018-12-24 DIAGNOSIS — M25571 Pain in right ankle and joints of right foot: Secondary | ICD-10-CM | POA: Diagnosis not present

## 2018-12-29 ENCOUNTER — Encounter: Payer: Self-pay | Admitting: Family Medicine

## 2019-01-05 DIAGNOSIS — M0609 Rheumatoid arthritis without rheumatoid factor, multiple sites: Secondary | ICD-10-CM | POA: Diagnosis not present

## 2020-01-18 ENCOUNTER — Telehealth: Payer: Self-pay | Admitting: Family Medicine

## 2020-01-18 NOTE — Telephone Encounter (Signed)
We received letter from Peggy [name] wanting letter for disability.  We have not seen this patient in over 3 years. I called pt cell [facility]tr[facility].  We need cpe/ fasting labs and signed Release from patient for attorney to receive any info.

## 2021-03-22 ENCOUNTER — Encounter: Payer: Self-pay | Admitting: Family Medicine
